# Patient Record
Sex: Female | Born: 1990 | Race: White | Hispanic: No | Marital: Single | Smoking: Current every day smoker
Health system: Southern US, Community
[De-identification: ages and names within clinical notes are randomized; demographics above are authoritative.]

## PROBLEM LIST (undated history)

## (undated) DIAGNOSIS — N760 Acute vaginitis: Secondary | ICD-10-CM

## (undated) DIAGNOSIS — N946 Dysmenorrhea, unspecified: Secondary | ICD-10-CM

## (undated) DIAGNOSIS — F17200 Nicotine dependence, unspecified, uncomplicated: Secondary | ICD-10-CM

## (undated) DIAGNOSIS — K921 Melena: Secondary | ICD-10-CM

## (undated) DIAGNOSIS — N898 Other specified noninflammatory disorders of vagina: Secondary | ICD-10-CM

## (undated) DIAGNOSIS — R109 Unspecified abdominal pain: Secondary | ICD-10-CM

## (undated) DIAGNOSIS — B9689 Other specified bacterial agents as the cause of diseases classified elsewhere: Secondary | ICD-10-CM

## (undated) DIAGNOSIS — R1084 Generalized abdominal pain: Secondary | ICD-10-CM

## (undated) DIAGNOSIS — K641 Second degree hemorrhoids: Secondary | ICD-10-CM

## (undated) DIAGNOSIS — L709 Acne, unspecified: Secondary | ICD-10-CM

## (undated) DIAGNOSIS — R102 Pelvic and perineal pain: Secondary | ICD-10-CM

---
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
## 2018-03-28 ENCOUNTER — Inpatient Hospital Stay
Admit: 2018-03-28 | Discharge: 2018-03-28 | Disposition: A | Discharge status: Home / Self Care | Payer: MEDICAID | Attending: Emergency Medicine

## 2018-03-28 DIAGNOSIS — F1110 Opioid abuse, uncomplicated: Secondary | ICD-10-CM

## 2018-03-28 LAB — CBC WITH AUTO DIFFERENTIAL
Basophils %: 0.6 % (ref 0–3)
Eosinophils %: 2.1 % (ref 0–5)
Hematocrit: 42 % (ref 37.0–50.0)
Hemoglobin: 13.4 gm/dl (ref 13.0–17.2)
Immature Granulocytes: 0.2 % (ref 0.0–3.0)
Lymphocytes %: 28.7 % (ref 28–48)
MCH: 28.2 pg (ref 25.4–34.6)
MCHC: 31.9 gm/dl (ref 30.0–36.0)
MCV: 88.2 fL (ref 80.0–98.0)
MPV: 9.9 fL (ref 6.0–10.0)
Monocytes %: 9 % (ref 1–13)
Neutrophils %: 59.4 % (ref 34–64)
Nucleated RBCs: 0 (ref 0–0)
Platelets: 235 10*3/uL (ref 140–450)
RBC: 4.76 M/uL (ref 3.60–5.20)
RDW-SD: 43.8 (ref 36.4–46.3)
WBC: 6.3 10*3/uL (ref 4.0–11.0)

## 2018-03-28 LAB — POC URINE MACROSCOPIC
Bilirubin, Urine: NEGATIVE
Bilirubin: NEGATIVE
Blood, Urine: NEGATIVE
Blood: NEGATIVE
Glucose, Ur: NEGATIVE mg/dl
Glucose: NEGATIVE mg/dl
Ketone: NEGATIVE mg/dl
Ketones, Urine: NEGATIVE mg/dl
Leukocyte Esterase, Urine: NEGATIVE
Leukocyte Esterase: NEGATIVE
Nitrite, Urine: NEGATIVE
Nitrites: NEGATIVE
Protein, UA: 30 mg/dl — AB
Protein: 30 mg/dl — AB
Specific Gravity, UA: 1.015 (ref 1.005–1.030)
Specific gravity: 1.015 (ref 1.005–1.030)
Urobilinogen, UA, POCT: 0.2 EU/dl (ref 0.0–1.0)
Urobilinogen: 0.2 EU/dl (ref 0.0–1.0)
pH (UA): 7 (ref 5–9)
pH, UA: 7 (ref 5–9)

## 2018-03-28 LAB — DRUG SCREEN, URINE
Amphetamine: NEGATIVE
Amphetamines: NEGATIVE
Barbiturates: NEGATIVE
Barbiturates: NEGATIVE
Benzodiazapines: POSITIVE — AB
Benzodiazepines: POSITIVE — AB
Cocaine: POSITIVE — AB
Cocaine: POSITIVE — AB
Marijuana: POSITIVE — AB
Marijuana: POSITIVE — AB
Methadone: NEGATIVE
Methadone: NEGATIVE
Opiates: POSITIVE — AB
Opiates: POSITIVE — AB
Phencyclidine: NEGATIVE
Phencyclidine: NEGATIVE

## 2018-03-28 LAB — COMPREHENSIVE METABOLIC PANEL
ALT: 12 U/L (ref 12–78)
AST: 23 U/L (ref 15–37)
Albumin: 3.8 gm/dl (ref 3.4–5.0)
Alkaline Phosphatase: 80 U/L (ref 45–117)
Anion Gap: 6 mmol/L (ref 5–15)
BUN: 14 mg/dl (ref 7–25)
CO2: 33 mEq/L — ABNORMAL HIGH (ref 21–32)
Calcium: 9.2 mg/dl (ref 8.5–10.1)
Chloride: 103 mEq/L (ref 98–107)
Creatinine: 0.9 mg/dl (ref 0.6–1.3)
EGFR IF NonAfrican American: >60
GFR African American: >60
Glucose: 60 mg/dl — ABNORMAL LOW (ref 74–106)
Potassium: 3.9 mEq/L (ref 3.5–5.1)
Sodium: 142 mEq/L (ref 136–145)
Total Bilirubin: 0.3 mg/dl (ref 0.2–1.0)
Total Protein: 8 gm/dl (ref 6.4–8.2)

## 2018-03-28 LAB — ETHYL ALCOHOL
ALCOHOL(ETHYL),SERUM: <3 mg/dl (ref 0.0–9.0)
Ethyl Alcohol: <3 mg/dl (ref 0.0–9.0)

## 2018-03-28 LAB — METABOLIC PANEL, COMPREHENSIVE
ALT (SGPT): 12 U/L (ref 12–78)
AST (SGOT): 23 U/L (ref 15–37)
Albumin: 3.8 gm/dl (ref 3.4–5.0)
Alk. phosphatase: 80 U/L (ref 45–117)
Anion gap: 6 mmol/L (ref 5–15)
BUN: 14 mg/dl (ref 7–25)
Bilirubin, total: 0.3 mg/dl (ref 0.2–1.0)
CO2: 33 mEq/L — ABNORMAL HIGH (ref 21–32)
Calcium: 9.2 mg/dl (ref 8.5–10.1)
Chloride: 103 mEq/L (ref 98–107)
Creatinine: 0.9 mg/dl (ref 0.6–1.3)
GFR est AA: >60
GFR est non-AA: >60
Glucose: 60 mg/dl — ABNORMAL LOW (ref 74–106)
Potassium: 3.9 mEq/L (ref 3.5–5.1)
Protein, total: 8 gm/dl (ref 6.4–8.2)
Sodium: 142 mEq/L (ref 136–145)

## 2018-03-28 LAB — CBC WITH AUTOMATED DIFF
BASOPHILS: 0.6 % (ref 0–3)
EOSINOPHILS: 2.1 % (ref 0–5)
HCT: 42 % (ref 37.0–50.0)
HGB: 13.4 gm/dl (ref 13.0–17.2)
IMMATURE GRANULOCYTES: 0.2 % (ref 0.0–3.0)
LYMPHOCYTES: 28.7 % (ref 28–48)
MCH: 28.2 pg (ref 25.4–34.6)
MCHC: 31.9 gm/dl (ref 30.0–36.0)
MCV: 88.2 fL (ref 80.0–98.0)
MONOCYTES: 9 % (ref 1–13)
MPV: 9.9 fL (ref 6.0–10.0)
NEUTROPHILS: 59.4 % (ref 34–64)
NRBC: 0 (ref 0–0)
PLATELET: 235 10*3/uL (ref 140–450)
RBC: 4.76 M/uL (ref 3.60–5.20)
RDW-SD: 43.8 (ref 36.4–46.3)
WBC: 6.3 10*3/uL (ref 4.0–11.0)

## 2018-03-28 MED ORDER — NALOXONE 2 MG/ACTUATION NASAL SPRAY
2 mg/actuation | NASAL | 0 refills | Status: AC
Start: 2018-03-28 — End: ?

## 2018-03-28 MED ORDER — ONDANSETRON 4 MG TAB, RAPID DISSOLVE
4 mg | ORAL | Status: AC
Start: 2018-03-28 — End: 2018-03-28
  Administered 2018-03-28: 23:00:00 via ORAL

## 2018-03-28 MED ORDER — ONDANSETRON HCL 4 MG TAB
4 mg | ORAL_TABLET | Freq: Three times a day (TID) | ORAL | 0 refills | Status: AC | PRN
Start: 2018-03-28 — End: 2018-03-31

## 2018-03-28 MED ORDER — BUPRENORPHINE-NALOXONE 8 MG-2 MG SUBLINGUAL TAB
8-2 mg | Freq: Once | SUBLINGUAL | Status: AC
Start: 2018-03-28 — End: 2018-03-28
  Administered 2018-03-28: 21:00:00 via SUBLINGUAL

## 2018-03-28 MED ORDER — BUPRENORPHINE 4 MG-NALOXONE 1 MG SUBLINGUAL FILM
4-1 mg | ORAL_FILM | Freq: Every day | SUBLINGUAL | 0 refills | Status: AC
Start: 2018-03-28 — End: 2018-03-30

## 2018-03-28 MED ORDER — CLONIDINE 0.1 MG TAB
0.1 mg | ORAL | Status: AC
Start: 2018-03-28 — End: 2018-03-28
  Administered 2018-03-28: 23:00:00 via ORAL

## 2018-03-28 MED FILL — BUPRENORPHINE-NALOXONE 8 MG-2 MG SUBLINGUAL TAB: 8-2 mg | SUBLINGUAL | Qty: 1

## 2018-03-28 MED FILL — ONDANSETRON 4 MG TAB, RAPID DISSOLVE: 4 mg | ORAL | Qty: 1

## 2018-03-28 MED FILL — CLONIDINE 0.1 MG TAB: 0.1 mg | ORAL | Qty: 1

## 2018-03-28 NOTE — ED Notes (Signed)
7:00 PM  03/28/18     Discharge instructions given to patient (name) with verbalization of understanding. Patient accompanied by patient.  Patient discharged with the following prescriptions   Current Discharge Medication List      START taking these medications    Details   buprenorphine-naloxone (SUBOXONE) 4-1 mg film sublingual film 2 Film by SubLINGual route daily for 2 days. Max Daily Amount: 4 Film. First dose take 2 films (8mg) 24 hours after discharge from Emergency Room.  You may take 1 additional film every hour after first dose if withdrawal symptoms persist.  Maximum 4 films daily (16mg)  Indications: Opioid use  Qty: 8 Film, Refills: 0    Comments: May substitute tablets for film if less expensive.  Please call if 4 mg strength not available  Associated Diagnoses: Opiate abuse, continuous (HCC)      ondansetron hcl (ZOFRAN) 4 mg tablet Take 1 Tab by mouth every eight (8) hours as needed for Nausea for up to 3 days.  Qty: 15 Tab, Refills: 0      naloxone 2 mg/actuation spry Use 1 spray intranasally, then discard. Repeat with new spray every 2 min as needed for opioid overdose symptoms, alternating nostrils.  Indications: Decrease in Rate & Depth of Breathing due to Opioid Drug  Qty: 2 Units, Refills: 0          . Patient discharged to Home (destination).      Kenneth R Emilio, RN

## 2018-03-28 NOTE — ED Provider Notes (Signed)
[facility]  Emergency Department Treatment Report    Patient: Shannon Acevedo Age: 27 y.o. Sex: female    Date of Birth: [birth year] Admit Date: 03/28/2018 PCP: None   MRN: [id number]  CSN: [id number]  Attending: Tawanna Cooler, MD   Room: ER22/ER22 Time Dictated: 10:58 PM        Chief Complaint   Chief Complaint   Patient presents with   ??? Mental Health Problem     [facility] program       History of Present Illness     Patient with history of polysubstance abuse, including heroin, Xanax, cocaine presents for evaluation of withdrawal symptoms.  She is interested in detoxification from heroin.  She reports abdominal pain and diarrhea and nausea very typical for her normal withdrawal symptoms.  Last use was at 3 PM yesterday.  She denies any SI or HI, no other complaints at this time.    Review of Systems   Review of Systems   Constitutional: Negative for fever.   HENT: Negative for sore throat.    Eyes: Negative for double vision.   Respiratory: Negative for shortness of breath.    Cardiovascular: Negative for chest pain.   Gastrointestinal: Positive for diarrhea and nausea. Negative for abdominal pain.   Genitourinary: Negative for dysuria.   Musculoskeletal: Negative for back pain.   Skin: Negative for rash.   Neurological: Negative for headaches.   Psychiatric/Behavioral: Positive for substance abuse. Negative for suicidal ideas.       Past Medical/Surgical History     Past Medical History:   Diagnosis Date   ??? Abuse    ??? Anemia NEC    ??? Anxiety    ??? Arrhythmia    ??? Bipolar 1 disorder, depressed ([place])    ??? Depression    ??? Depression    ??? Trauma      Past Surgical History:   Procedure Laterality Date   ??? HX GYN         Social History     Social History     Socioeconomic History   ??? Marital status: SINGLE     Spouse name: Not on file   ??? Number of children: Not on file   ??? Years of education: Not on file   ??? Highest education level: Not on file   Tobacco Use    ??? Smoking status: Current Every Day Smoker     Packs/day: 1.00     Years: 2.00     Pack years: 2.00   ??? Smokeless tobacco: Never Used   Substance and Sexual Activity   ??? Alcohol use: No   ??? Drug use: Yes     Types: Heroin, Cocaine, Marijuana   ??? Sexual activity: Never       Family History     Family History   Problem Relation Age of Onset   ??? Asthma Mother    ??? Alcohol abuse Father    ??? Psychiatric Disorder Father    ??? Cancer Paternal Grandmother    ??? Heart Disease Paternal Grandfather        Current Medications     None       Allergies     Allergies   Allergen Reactions   ??? Penicillins Anaphylaxis       Physical Exam/ED Course / Medical Decision Making     ED Triage Vitals [03/28/18 1522]   Enc Vitals Group      BP 117/75  Pulse (Heart Rate) 96      Resp Rate 18      Temp 98.3 ??F (36.8 ??C)      Temp src       O2 Sat (%) 99 %      Weight 139 lb 15.9 oz      Height 5' 7"       Head Circumference       Peak Flow       Pain Score       Pain Loc       Pain Edu?       Excl. in [place]?          Physical exam:  General:  Well-developed, well-nourished, no apparent distress.    Head:  Normocephalic atraumatic.    Eyes:  Pupils midrange extraocular movements intact.  No pallor or conjunctival injection.    Nose:  No rhinorrhea, inspection grossly normal.    Ears:  Grossly normal to inspection, no discharge.    Mouth:  Mucous membranes moist, no appreciable intraoral lesion.    Neck/Back:  Trachea midline, no asymmetry.    Chest:  Grossly normal inspection, symmetric chest rise.    Pulmonary:  Clear to auscultation bilaterally no wheezes rhonchi or rales.    Cardiovascular:  S1-S2 no murmurs rubs or gallops.    Abdomen: Soft, nontender, nondistended no guarding rebound or peritoneal signs.    Extremities:  Grossly normal to inspection, peripheral pulses intact    Neurologic:  Alert and oriented no appreciable focal neurologic deficit    Skin:  Warm and dry  Psychiatric:  Grossly normal mood and affect   Nursing note reviewed, vital signs reviewed.    ED course:  Patient presents with mild opiate withdrawal symptoms, candidate for the [facility] program she is afebrile and not tachycardic saturation normal on room air we will rule out pregnancy and send basic labs, initiate Suboxone therapy in ED if appropriate     Please see nursing documentation for DSM 5 diagnostic sheet and COWS scale.    COWS 11, given suboxone here.    Labs unremarkable    Patient developed opiate withdrawal symptoms, was treated here, she was stable to follow up as an outpatient, given medications for opiate withdrawal    Patient's presentation, history, physical exam and laboratory evaluations were reviewed.  At this time patient was felt to be stable for outpatient management and follow with primary care/specialist.  Patient was instructed to return to the emergency department with any concerns.    Disposition:    Discharged home      Portions of this chart were created with Dragon medical speech to text program.   Unrecognized errors may be present.          Final Diagnosis       ICD-10-CM ICD-9-CM   1. Opiate abuse, continuous (HCC) F11.10 305.51         Diagnostic Studies   Lab:   Recent Results (from the past 12 hour(s))   CBC WITH AUTOMATED DIFF    Collection Time: 03/28/18  4:33 PM   Result Value Ref Range    WBC 6.3 4.0 - 11.0 1000/mm3    RBC 4.76 3.60 - 5.20 M/uL    HGB 13.4 13.0 - 17.2 gm/dl    HCT 42.0 37.0 - 50.0 %    MCV 88.2 80.0 - 98.0 fL    MCH 28.2 25.4 - 34.6 pg    MCHC 31.9 30.0 - 36.0 gm/dl  PLATELET 235 140 - 450 1000/mm3    MPV 9.9 6.0 - 10.0 fL    RDW-SD 43.8 36.4 - 46.3      NRBC 0 0 - 0      IMMATURE GRANULOCYTES 0.2 0.0 - 3.0 %    NEUTROPHILS 59.4 34 - 64 %    LYMPHOCYTES 28.7 28 - 48 %    MONOCYTES 9.0 1 - 13 %    EOSINOPHILS 2.1 0 - 5 %    BASOPHILS 0.6 0 - 3 %   METABOLIC PANEL, COMPREHENSIVE    Collection Time: 03/28/18  4:33 PM   Result Value Ref Range    Sodium 142 136 - 145 mEq/L     Potassium 3.9 3.5 - 5.1 mEq/L    Chloride 103 98 - 107 mEq/L    CO2 33 (H) 21 - 32 mEq/L    Glucose 60 (L) 74 - 106 mg/dl    BUN 14 7 - 25 mg/dl    Creatinine 0.9 0.6 - 1.3 mg/dl    GFR est AA >60.0      GFR est non-AA >60      Calcium 9.2 8.5 - 10.1 mg/dl    AST (SGOT) 23 15 - 37 U/L    ALT (SGPT) 12 12 - 78 U/L    Alk. phosphatase 80 45 - 117 U/L    Bilirubin, total 0.3 0.2 - 1.0 mg/dl    Protein, total 8.0 6.4 - 8.2 gm/dl    Albumin 3.8 3.4 - 5.0 gm/dl    Anion gap 6 5 - 15 mmol/L   ETHYL ALCOHOL    Collection Time: 03/28/18  4:33 PM   Result Value Ref Range    ALCOHOL(ETHYL),SERUM <3.0 0.0 - 9.0 mg/dl   HCG URINE, QL    Collection Time: 03/28/18  5:08 PM   Result Value Ref Range    HCG urine, QL NEGATIVE NEGATIVE     DRUG SCREEN, URINE    Collection Time: 03/28/18  5:12 PM   Result Value Ref Range    Amphetamine NEGATIVE NEGATIVE      Barbiturates NEGATIVE NEGATIVE      Benzodiazepines POSITIVE (A) NEGATIVE      Cocaine POS (A) NEGATIVE      Marijuana POSITIVE (A) NEGATIVE      Methadone NEGATIVE NEGATIVE      Opiates POSITIVE (A) NEGATIVE      Phencyclidine NEGATIVE NEGATIVE     POC URINE MACROSCOPIC    Collection Time: 03/28/18  5:12 PM   Result Value Ref Range    Glucose Negative NEGATIVE,Negative mg/dl    Bilirubin Negative NEGATIVE,Negative      Ketone Negative NEGATIVE,Negative mg/dl    Specific gravity 1.015 1.005 - 1.030      Blood Negative NEGATIVE,Negative      pH (UA) 7.0 5 - 9      Protein 30 (A) NEGATIVE,Negative mg/dl    Urobilinogen 0.2 0.0 - 1.0 EU/dl    Nitrites Negative NEGATIVE,Negative      Leukocyte Esterase Negative NEGATIVE,Negative      Color Yellow      Appearance Clear         Imaging:    No results found.               Medications   buprenorphine-naloxone (SUBOXONE) 8-18m SL tablet (1 Tab SubLINGual Given 03/28/18 1726)   ondansetron (ZOFRAN ODT) tablet 4 mg (4 mg Oral Given 03/28/18 1844)   cloNIDine HCl (CATAPRES) tablet 0.1  mg (0.1 mg Oral Given 03/28/18 1854)           Results of lab/radiology tests were discussed with the patient. . All questions were answered and concerns addressed.        My signature above authenticates this document and my orders, the final    diagnosis (es), discharge prescription (s), and instructions in the Epic record.  If you have any questions please contact [phone].

## 2018-03-28 NOTE — ED Notes (Signed)
Pt provided with [facility] program packet to begin reviewing.

## 2018-03-28 NOTE — ED Triage Notes (Signed)
Pt here for withdrawal symptoms from heroin, pt is sweating, vomiting diarrhea,  Last use yesterday 1500.    Interested in the [facility] program.

## 2018-03-28 NOTE — Progress Notes (Signed)
Behavioral Health:  This writer consulted by Dr. Sharkey on Pt, a 27 y.o. Caucasian American female in bed #22, with a history depression, who presented to [facility] with c/o symptoms of withdrawal.  Pt denies suicidal/ homicidal ideations and psychosis. This writer provided psychoeducation on the [facility]OU[facility] program. Pt expressed a desire to voluntary participate in the [facility] Program.  This writer provided Pt with support to complete ROI for the [facility]. Plan:  Pt will voluntarily participate in the [facility] Program via [facility].  ROI was obtained, faxed to [facility] and  given to registration to scan into chart.

## 2018-03-28 NOTE — ED Notes (Signed)
Pt provided with [facility] program packet to begin reviewing.

## 2018-03-28 NOTE — ED Notes (Signed)
Pt here for withdrawal symptoms from heroin, pt is sweating, vomiting diarrhea,  Last use yesterday 1500.    [facility]ed in the [facility].

## 2018-03-28 NOTE — Progress Notes (Signed)
Behavioral Health:  This Clinical research associatewriter consulted by Dr. Sherian MaroonSharkey on Pt, a 27 y.o. Caucasian American female in bed #22, with a history depression, who presented to [facility] with c/o symptoms of withdrawal.  Pt denies suicidal/ homicidal ideations and psychosis. This Clinical research associatewriter provided psychoeducation on the [facility]OU[facility] program. Pt expressed a desire to voluntary participate in the [facility] Program.  This Clinical research associatewriter provided Pt with support to complete ROI for the [facility]. Plan:  Pt will voluntarily participate in the [facility] Program via [facility].  ROI was obtained, faxed to [facility] and  given to registration to scan into chart.

## 2018-03-28 NOTE — ED Notes (Signed)
7:00 PM  03/28/18     Discharge instructions given to patient (name) with verbalization of understanding. Patient accompanied by patient.  Patient discharged with the following prescriptions   Current Discharge Medication List      START taking these medications    Details   buprenorphine-naloxone (SUBOXONE) 4-1 mg film sublingual film 2 Film by SubLINGual route daily for 2 days. Max Daily Amount: 4 Film. First dose take 2 films (8mg ) 24 hours after discharge from Emergency Room.  You may take 1 additional film every hour after first dose if withdrawal symptoms persist.  Maximum 4 films daily (16mg )  Indications: Opioid use  Qty: 8 Film, Refills: 0    Comments: May substitute tablets for film if less expensive.  Please call if 4 mg strength not available  Associated Diagnoses: Opiate abuse, continuous (HCC)      ondansetron hcl (ZOFRAN) 4 mg tablet Take 1 Tab by mouth every eight (8) hours as needed for Nausea for up to 3 days.  Qty: 15 Tab, Refills: 0      naloxone 2 mg/actuation spry Use 1 spray intranasally, then discard. Repeat with new spray every 2 min as needed for opioid overdose symptoms, alternating nostrils.  Indications: Decrease in Rate & Depth of Breathing due to Opioid Drug  Qty: 2 Units, Refills: 0          . Patient discharged to Home (destination).      Dewitt HoesKenneth R Emilio, RN

## 2018-03-28 NOTE — ED Provider Notes (Signed)
[place]  Emergency Department Treatment Report    Patient: Shannon Acevedo Age: 27 y.o. Sex: female    Date of Birth: [birth year] Admit Date: 03/28/2018 PCP: None   MRN: [id number]  CSN: [id number]  Attending: Tawanna Cooler, MD   Room: ER22/ER22 Time Dictated: 10:58 PM        Chief Complaint   Chief Complaint   Patient presents with   ??? Mental Health Problem     [facility] program       History of Present Illness     Patient with history of polysubstance abuse, including heroin, Xanax, cocaine presents for evaluation of withdrawal symptoms.  She is interested in detoxification from heroin.  She reports abdominal pain and diarrhea and nausea very typical for her normal withdrawal symptoms.  Last use was at 3 PM yesterday.  She denies any SI or HI, no other complaints at this time.    Review of Systems   Review of Systems   Constitutional: Negative for fever.   HENT: Negative for sore throat.    Eyes: Negative for double vision.   Respiratory: Negative for shortness of breath.    Cardiovascular: Negative for chest pain.   Gastrointestinal: Positive for diarrhea and nausea. Negative for abdominal pain.   Genitourinary: Negative for dysuria.   Musculoskeletal: Negative for back pain.   Skin: Negative for rash.   Neurological: Negative for headaches.   Psychiatric/Behavioral: Positive for substance abuse. Negative for suicidal ideas.       Past Medical/Surgical History     Past Medical History:   Diagnosis Date   ??? Abuse    ??? Anemia NEC    ??? Anxiety    ??? Arrhythmia    ??? Bipolar 1 disorder, depressed ([facility])    ??? Depression    ??? Depression    ??? Trauma      Past Surgical History:   Procedure Laterality Date   ??? HX GYN         Social History     Social History     Socioeconomic History   ??? Marital status: SINGLE     Spouse name: Not on file   ??? Number of children: Not on file   ??? Years of education: Not on file   ??? Highest education level: Not on file   Tobacco Use   ??? Smoking status: Current Every Day Smoker      Packs/day: 1.00     Years: 2.00     Pack years: 2.00   ??? Smokeless tobacco: Never Used   Substance and Sexual Activity   ??? Alcohol use: No   ??? Drug use: Yes     Types: Heroin, Cocaine, Marijuana   ??? Sexual activity: Never       Family History     Family History   Problem Relation Age of Onset   ??? Asthma Mother    ??? Alcohol abuse Father    ??? Psychiatric Disorder Father    ??? Cancer Paternal Grandmother    ??? Heart Disease Paternal Grandfather        Current Medications     None       Allergies     Allergies   Allergen Reactions   ??? Penicillins Anaphylaxis       Physical Exam/ED Course / Medical Decision Making     ED Triage Vitals [03/28/18 1522]   Enc Vitals Group      BP 117/75  Pulse (Heart Rate) 96      Resp Rate 18      Temp 98.3 ??F (36.8 ??C)      Temp src       O2 Sat (%) 99 %      Weight 139 lb 15.9 oz      Height '5\' 7"'       Head Circumference       Peak Flow       Pain Score       Pain Loc       Pain Edu?       Excl. in [place]?          Physical exam:  General:  Well-developed, well-nourished, no apparent distress.    Head:  Normocephalic atraumatic.    Eyes:  Pupils midrange extraocular movements intact.  No pallor or conjunctival injection.    Nose:  No rhinorrhea, inspection grossly normal.    Ears:  Grossly normal to inspection, no discharge.    Mouth:  Mucous membranes moist, no appreciable intraoral lesion.    Neck/Back:  Trachea midline, no asymmetry.    Chest:  Grossly normal inspection, symmetric chest rise.    Pulmonary:  Clear to auscultation bilaterally no wheezes rhonchi or rales.    Cardiovascular:  S1-S2 no murmurs rubs or gallops.    Abdomen: Soft, nontender, nondistended no guarding rebound or peritoneal signs.    Extremities:  Grossly normal to inspection, peripheral pulses intact    Neurologic:  Alert and oriented no appreciable focal neurologic deficit    Skin:  Warm and dry  Psychiatric:  Grossly normal mood and affect  Nursing note reviewed, vital signs reviewed.    ED course:  Patient  presents with mild opiate withdrawal symptoms, candidate for the [facility] program she is afebrile and not tachycardic saturation normal on room air we will rule out pregnancy and send basic labs, initiate Suboxone therapy in ED if appropriate     Please see nursing documentation for DSM 5 diagnostic sheet and COWS scale.    COWS 11, given suboxone here.    Labs unremarkable    Patient developed opiate withdrawal symptoms, was treated here, she was stable to follow up as an outpatient, given medications for opiate withdrawal    Patient's presentation, history, physical exam and laboratory evaluations were reviewed.  At this time patient was felt to be stable for outpatient management and follow with primary care/specialist.  Patient was instructed to return to the emergency department with any concerns.    Disposition:    Discharged home      Portions of this chart were created with Dragon medical speech to text program.   Unrecognized errors may be present.          Final Diagnosis       ICD-10-CM ICD-9-CM   1. Opiate abuse, continuous (HCC) F11.10 305.51         Diagnostic Studies   Lab:   Recent Results (from the past 12 hour(s))   CBC WITH AUTOMATED DIFF    Collection Time: 03/28/18  4:33 PM   Result Value Ref Range    WBC 6.3 4.0 - 11.0 1000/mm3    RBC 4.76 3.60 - 5.20 M/uL    HGB 13.4 13.0 - 17.2 gm/dl    HCT 42.0 37.0 - 50.0 %    MCV 88.2 80.0 - 98.0 fL    MCH 28.2 25.4 - 34.6 pg    MCHC 31.9 30.0 - 36.0 gm/dl  PLATELET 235 140 - 450 1000/mm3    MPV 9.9 6.0 - 10.0 fL    RDW-SD 43.8 36.4 - 46.3      NRBC 0 0 - 0      IMMATURE GRANULOCYTES 0.2 0.0 - 3.0 %    NEUTROPHILS 59.4 34 - 64 %    LYMPHOCYTES 28.7 28 - 48 %    MONOCYTES 9.0 1 - 13 %    EOSINOPHILS 2.1 0 - 5 %    BASOPHILS 0.6 0 - 3 %   METABOLIC PANEL, COMPREHENSIVE    Collection Time: 03/28/18  4:33 PM   Result Value Ref Range    Sodium 142 136 - 145 mEq/L    Potassium 3.9 3.5 - 5.1 mEq/L    Chloride 103 98 - 107 mEq/L    CO2 33 (H) 21 - 32 mEq/L     Glucose 60 (L) 74 - 106 mg/dl    BUN 14 7 - 25 mg/dl    Creatinine 0.9 0.6 - 1.3 mg/dl    GFR est AA >60.0      GFR est non-AA >60      Calcium 9.2 8.5 - 10.1 mg/dl    AST (SGOT) 23 15 - 37 U/L    ALT (SGPT) 12 12 - 78 U/L    Alk. phosphatase 80 45 - 117 U/L    Bilirubin, total 0.3 0.2 - 1.0 mg/dl    Protein, total 8.0 6.4 - 8.2 gm/dl    Albumin 3.8 3.4 - 5.0 gm/dl    Anion gap 6 5 - 15 mmol/L   ETHYL ALCOHOL    Collection Time: 03/28/18  4:33 PM   Result Value Ref Range    ALCOHOL(ETHYL),SERUM <3.0 0.0 - 9.0 mg/dl   HCG URINE, QL    Collection Time: 03/28/18  5:08 PM   Result Value Ref Range    HCG urine, QL NEGATIVE NEGATIVE     DRUG SCREEN, URINE    Collection Time: 03/28/18  5:12 PM   Result Value Ref Range    Amphetamine NEGATIVE NEGATIVE      Barbiturates NEGATIVE NEGATIVE      Benzodiazepines POSITIVE (A) NEGATIVE      Cocaine POS (A) NEGATIVE      Marijuana POSITIVE (A) NEGATIVE      Methadone NEGATIVE NEGATIVE      Opiates POSITIVE (A) NEGATIVE      Phencyclidine NEGATIVE NEGATIVE     POC URINE MACROSCOPIC    Collection Time: 03/28/18  5:12 PM   Result Value Ref Range    Glucose Negative NEGATIVE,Negative mg/dl    Bilirubin Negative NEGATIVE,Negative      Ketone Negative NEGATIVE,Negative mg/dl    Specific gravity 1.015 1.005 - 1.030      Blood Negative NEGATIVE,Negative      pH (UA) 7.0 5 - 9      Protein 30 (A) NEGATIVE,Negative mg/dl    Urobilinogen 0.2 0.0 - 1.0 EU/dl    Nitrites Negative NEGATIVE,Negative      Leukocyte Esterase Negative NEGATIVE,Negative      Color Yellow      Appearance Clear         Imaging:    No results found.               Medications   buprenorphine-naloxone (SUBOXONE) 8-21m SL tablet (1 Tab SubLINGual Given 03/28/18 1726)   ondansetron (ZOFRAN ODT) tablet 4 mg (4 mg Oral Given 03/28/18 1844)   cloNIDine HCl (CATAPRES) tablet 0.1  mg (0.1 mg Oral Given 03/28/18 1854)          Results of lab/radiology tests were discussed with the patient. . All questions were answered and concerns  addressed.        My signature above authenticates this document and my orders, the final    diagnosis (es), discharge prescription (s), and instructions in the Epic record.  If you have any questions please contact [phone].

## 2018-03-29 LAB — HCG URINE, QL
HCG urine, QL: NEGATIVE
Pregnancy Test(Urn): NEGATIVE

## 2018-04-04 ENCOUNTER — Inpatient Hospital Stay
Admit: 2018-04-04 | Discharge: 2018-04-05 | Disposition: A | Discharge status: Home / Self Care | Payer: MEDICAID | Attending: Emergency Medicine

## 2018-04-04 DIAGNOSIS — F1123 Opioid dependence with withdrawal: Secondary | ICD-10-CM

## 2018-04-04 MED ORDER — BUPRENORPHINE 4 MG-NALOXONE 1 MG SUBLINGUAL FILM
4-1 mg | ORAL_FILM | Freq: Every day | SUBLINGUAL | 0 refills | Status: AC
Start: 2018-04-04 — End: 2018-04-07

## 2018-04-04 MED ORDER — BUPRENORPHINE-NALOXONE 8 MG-2 MG SUBLINGUAL TAB
8-2 mg | Freq: Once | SUBLINGUAL | Status: AC
Start: 2018-04-04 — End: 2018-04-04
  Administered 2018-04-04: via SUBLINGUAL

## 2018-04-04 MED FILL — BUPRENORPHINE-NALOXONE 8 MG-2 MG SUBLINGUAL TAB: 8-2 mg | SUBLINGUAL | Qty: 1

## 2018-04-04 NOTE — ED Provider Notes (Signed)
[facility]  Emergency Department Treatment Report        Patient: Shannon Acevedo Age: 27 y.o. Sex: female    Date of Birth: July 16, 1991 Admit Date: 04/04/2018 PCP: None   MRN: [id number]  CSN: [id number]  Attending: Jannetta Quint, DO   Room: H06/H06 Time Dictated: 7:31 PM [id number]       Chief Complaint   Withdrawing from heroin    History of Present Illness   27 y.o. female states that she was seen in the emergency department a week ago because of heroin withdrawal.  She was discharged with prescription for Suboxone and states that she has been taking half of a strip because when she went to [facility]up with [facility] was told that they did not take her insurance.  She has a follow-up appointment with another facility on September 10 but her last dose of Suboxone was yesterday and she is feeling sweaty today and some associated abdominal cramping and diarrhea x2.  She is not vomiting.  She is taking Zofran for the nausea and that is been helping.  She states she last used heroin a week ago.    Review of Systems   Review of Systems   Constitutional: Negative for fever.        Positive sweats   Eyes: Negative for discharge and redness.   Respiratory: Negative for shortness of breath and stridor.    Cardiovascular: Negative for leg swelling.   Gastrointestinal: Positive for diarrhea and nausea.        Abdominal cramping   Musculoskeletal: Negative for falls.   Skin: Negative for rash.   Neurological: Negative for seizures.   Psychiatric/Behavioral: Positive for substance abuse.       Past Medical/Surgical History     Past Medical History:   Diagnosis Date   ??? Abuse    ??? Anemia NEC    ??? Anxiety    ??? Arrhythmia    ??? Bipolar 1 disorder, depressed (HCC)    ??? Depression    ??? Depression    ??? Trauma      Past Surgical History:   Procedure Laterality Date   ??? HX GYN         Social History     Social History     Socioeconomic History   ??? Marital status: SINGLE     Spouse name: Not on file    ??? Number of children: Not on file   ??? Years of education: Not on file   ??? Highest education level: Not on file   Occupational History   ??? Not on file   Social Needs   ??? Financial resource strain: Not on file   ??? Food insecurity:     Worry: Not on file     Inability: Not on file   ??? Transportation needs:     Medical: Not on file     Non-medical: Not on file   Tobacco Use   ??? Smoking status: Current Every Day Smoker     Packs/day: 1.00     Years: 2.00     Pack years: 2.00   ??? Smokeless tobacco: Never Used   Substance and Sexual Activity   ??? Alcohol use: No   ??? Drug use: Yes     Types: Heroin, Cocaine, Marijuana   ??? Sexual activity: Never   Lifestyle   ??? Physical activity:     Days per week: Not on file     Minutes per  session: Not on file   ??? Stress: Not on file   Relationships   ??? Social connections:     Talks on phone: Not on file     Gets together: Not on file     Attends religious service: Not on file     Active member of club or organization: Not on file     Attends meetings of clubs or organizations: Not on file     Relationship status: Not on file   ??? Intimate partner violence:     Fear of current or ex partner: Not on file     Emotionally abused: Not on file     Physically abused: Not on file     Forced sexual activity: Not on file   Other Topics Concern   ??? Not on file   Social History Narrative   ??? Not on file       Family History     Family History   Problem Relation Age of Onset   ??? Asthma Mother    ??? Alcohol abuse Father    ??? Psychiatric Disorder Father    ??? Cancer Paternal Grandmother    ??? Heart Disease Paternal Grandfather        Current Medications     Prior to Admission Medications   Prescriptions Last Dose Informant Patient Reported? Taking?   naloxone 2 mg/actuation spry   No No   Sig: Use 1 spray intranasally, then discard. Repeat with new spray every 2 min as needed for opioid overdose symptoms, alternating nostrils.  Indications: Decrease in Rate & Depth of Breathing due to Opioid Drug       Facility-Administered Medications: None       Allergies     Allergies   Allergen Reactions   ??? Penicillins Anaphylaxis       Physical Exam     ED Triage Vitals   ED Encounter Vitals Group      BP 04/04/18 1841 117/77      Pulse (Heart Rate) 04/04/18 1841 83      Resp Rate 04/04/18 1841 14      Temp 04/04/18 1841 98.5 ??F (36.9 ??C)      Temp src --       O2 Sat (%) 04/04/18 1841 100 %      Weight 04/04/18 1826 130 lb      Height 04/04/18 1826 5\' 7"      Physical Exam   Constitutional: She is oriented to person, place, and time.   General appearance: Patient appears well-developed and well-nourished.   HENT:   Head: Normocephalic and atraumatic.   Mouth/Throat: Oropharynx is clear and moist.   Eyes: Pupils are equal, round, and reactive to light. Conjunctivae are normal.   Neck: Neck supple. No thyromegaly present.   Cardiovascular: Normal rate, regular rhythm and normal heart sounds.   No murmur heard.  Pulmonary/Chest: Effort normal. No respiratory distress. She has no wheezes.   Abdominal: Soft. There is no tenderness.   Musculoskeletal: She exhibits no edema.   Lymphadenopathy:     She has no cervical adenopathy.   Neurological: She is alert and oriented to person, place, and time.   Skin: Skin is warm and dry. No rash noted.   Vitals reviewed.       Impression and Management Plan   Patient who presents with concerns that she has finished the initial prescription of Suboxone she was given and she does not have an appointment until September 10.  I will consult crisis clinician.      ED Course   Spoke to crisis clinician and she was not able to reach [facility] at [facility] as they were closed for the long weekend did leave a message.  Patient states she has an appointment September 10.  [facility]ries [facility] try [facility] in hopes of being seen sooner.  Medications   buprenorphine-naloxone (SUBOXONE) 8-2mg  SL tablet (1 Tab SubLINGual Given 04/04/18 1950)        Medical Decision Making    Patient not tremulous and not actively vomiting but states his remained off the heroin and continues to want to stay off the heroin    Final Diagnosis       ICD-10-CM ICD-9-CM   1. Opioid abuse (HCC) F11.10 305.50       Disposition     Dicharge home   Current Discharge Medication List      START taking these medications    Details   buprenorphine-naloxone (SUBOXONE) 4-1 mg film sublingual film 1 Film by SubLINGual route daily for 3 days. Max Daily Amount: 4 Film. First dose take 2 films (8mg ) 24 hours after discharge from Emergency Room.  You may take 1 additional film every hour after first dose if withdrawal symptoms persist.  Maximum 4 films daily (16mg )  Indications: Opioid use  Qty: 12 Film, Refills: 0    Comments: May substitute tablets for film if less expensive.  Please call if 4 mg strength not available  Associated Diagnoses: Opioid abuse (HCC)             The patient was personally evaluated by myself and Dr. Wilma FlavinBLOOM, ADAM S, DO who agrees with the above assessment and plan.    Farrel Demarkiana Torion Hulgan, PA-C  April 04, 2018    My signature above authenticates this document and my orders, the final ??  diagnosis (es), discharge prescription (s), and instructions in the Epic ??  record.  If you have any questions please contact [phone].  ??  Nursing notes have been reviewed by the physician/ advanced practice ??  Clinician.    Dragon medical dictation software was used for portions of this report. Unintended voice recognition errors may occur.

## 2018-04-04 NOTE — Progress Notes (Signed)
Behavioral Health:  This writer consulted by PA D. Houle on Pt, a 27 y.o. Caucasian American female in bed H#6, with a history of heroin use.  Pt presented to [facility] on last Friday 03/28/18 with c/o symptoms of withdrawal.  This writer provided psychoeducation on the [facility] program to which Pt expressed a desire to voluntary participate in the [facility] Program.  However, Pt is presenting today stating that [facility] (a [facility] Provider) would not take her insurance, so she is deciding to change insurance (which will take 90 days), in order to participate in program.  Pt denies suicidal/ homicidal ideations and psychosis.  This writer provided psychoeducation on [facility]'s policy as it related to prescribing controlled substance and informed Pt that a voluntary detox program is going to yield the best results for recovery.  Plan:  This writer consulted with PA. Houle on disposition.  PA Houle decided to give Pt perscription for 3 days, pending follow up with provider for long term care.

## 2018-04-04 NOTE — ED Notes (Signed)
Pt d/c by provider

## 2018-04-04 NOTE — Progress Notes (Signed)
Behavioral Health:  This Clinical research associatewriter consulted by PA D. Houle on Pt, a 27 y.o. Caucasian American female in bed H#6, with a history of heroin use.  Pt presented to [facility] on last Friday 03/28/18 with c/o symptoms of withdrawal.  This Clinical research associatewriter provided psychoeducation on the [facility] program to which Pt [facility]  However, Pt is presenting today stating that [facility] (a [facility] Provider) would not take her insurance, so she is deciding to change insurance (which will take 90 days), in order to participate in program.  Pt denies suicidal/ homicidal ideations and psychosis.  This Clinical research associatewriter provided psychoeducation on [facility]'s policy as it related to prescribing controlled substance and informed Pt that a voluntary detox program is going to yield the best results for recovery.  Plan:  This Clinical research associatewriter consulted with PA. Houle on disposition.  PA Houle decided to give Pt perscription for 3 days, pending follow up with provider for long term care.

## 2018-04-04 NOTE — ED Provider Notes (Signed)
ED Provider Notes by Dorise Hiss, PA at 04/04/18 1931                Author: Dorise Hiss, PA  Service: Emergency Medicine  Author Type: Physician Assistant       Filed: 04/04/18 2010  Date of Service: 04/04/18 1931  Status: Attested           Editor: Dorise Hiss, PA (Physician Assistant)  Cosigner: Jannetta Quint, DO at 04/06/18 2336          Attestation signed by Jannetta Quint, DO at 04/06/18 2336          I, Corinna Gab, D.O., interviewed and examined the patient. I discussed with the mid-level provider and agree with the evaluation and plans as documented here.      I personally saw the patinet, I refilled her suboxe to last through the holiday weekend. I think her risk of harm from the suboxone or diversion to someone else is far less than her risk of death from heroin or other opioids.                                  [facility] Care   Emergency Department Treatment Report                Patient: Shannon Acevedo  Age: 27 y.o.  Sex: female          Date of Birth: 03-05-1991  Admit Date: 04/04/2018  PCP: None     MRN: [id number]   CSN: [id number]   Attending: Jannetta Quint, DO         Room: H06/H06  Time Dictated: 7:31 PM  [id number]           Chief Complaint    Withdrawing from heroin        History of Present Illness     27 y.o. female  states that she was seen in the emergency department a week ago because of heroin withdrawal.  She was discharged with prescription for Suboxone and states that she has been taking half of a strip because when she went to [facility]  was told that they did not take her insurance.  She has a follow-up appointment with another facility on September 10 but her last dose of Suboxone was yesterday and she is feeling sweaty today and some associated abdominal cramping and diarrhea x2.   She is not vomiting.  She is taking Zofran for the nausea and that is been helping.  She states she last used heroin a week ago.        Review of Systems      Review of Systems    Constitutional: Negative for fever.         Positive sweats    Eyes: Negative for discharge and redness.    Respiratory: Negative for shortness of breath and stridor.     Cardiovascular: Negative for leg swelling.    Gastrointestinal: Positive for diarrhea and nausea .         Abdominal cramping    Musculoskeletal: Negative for falls.    Skin: Negative for rash.    Neurological: Negative for seizures.    Psychiatric/Behavioral: Positive for substance abuse.            Past Medical/Surgical History          Past Medical History:  Diagnosis  Date         ?  Abuse       ?  Anemia NEC       ?  Anxiety       ?  Arrhythmia       ?  Bipolar 1 disorder, depressed (HCC)       ?  Depression       ?  Depression           ?  Trauma            Past Surgical History:         Procedure  Laterality  Date          ?  HX GYN                 Social History          Social History          Socioeconomic History         ?  Marital status:  SINGLE              Spouse name:  Not on file         ?  Number of children:  Not on file     ?  Years of education:  Not on file     ?  Highest education level:  Not on file       Occupational History        ?  Not on file       Social Needs         ?  Financial resource strain:  Not on file        ?  Food insecurity:              Worry:  Not on file         Inability:  Not on file        ?  Transportation needs:              Medical:  Not on file         Non-medical:  Not on file       Tobacco Use         ?  Smoking status:  Current Every Day Smoker              Packs/day:  1.00         Years:  2.00         Pack years:  2.00         ?  Smokeless tobacco:  Never Used       Substance and Sexual Activity         ?  Alcohol use:  No     ?  Drug use:  Yes              Types:  Heroin, Cocaine, Marijuana         ?  Sexual activity:  Never       Lifestyle        ?  Physical activity:              Days per week:  Not on file         Minutes per session:  Not on file         ?   Stress:  Not on file       Relationships        ?  Social connections:              Talks on phone:  Not on file         Gets together:  Not on file         Attends religious service:  Not on file         Active member of club or organization:  Not on file         Attends meetings of clubs or organizations:  Not on file         Relationship status:  Not on file        ?  Intimate partner violence:              Fear of current or ex partner:  Not on file         Emotionally abused:  Not on file         Physically abused:  Not on file         Forced sexual activity:  Not on file        Other Topics  Concern        ?  Not on file       Social History Narrative        ?  Not on file             Family History          Family History         Problem  Relation  Age of Onset          ?  Asthma  Mother       ?  Alcohol abuse  Father       ?  Psychiatric Disorder  Father       ?  Cancer  Paternal Grandmother            ?  Heart Disease  Paternal Grandfather               Current Medications          Prior to Admission Medications     Prescriptions  Last Dose  Informant  Patient Reported?  Taking?      naloxone 2 mg/actuation spry      No  No      Sig: Use 1 spray intranasally, then discard. Repeat with new spray every 2 min as needed for opioid overdose symptoms, alternating nostrils.  Indications:  Decrease in Rate & Depth of Breathing due to Opioid Drug               Facility-Administered Medications: None             Allergies          Allergies        Allergen  Reactions         ?  Penicillins  Anaphylaxis             Physical Exam          ED Triage Vitals     ED Encounter Vitals Group            BP  04/04/18 1841  117/77        Pulse (Heart Rate)  04/04/18 1841  83        Resp Rate  04/04/18 1841  14        Temp  04/04/18 1841  98.5 ??F (36.9 ??C)        Temp  src  --          O2 Sat (%)  04/04/18 1841  100 %        Weight  04/04/18 1826  130 lb            Height  04/04/18 1826  5\' 7"         Physical Exam     Constitutional: She is oriented to person, place, and time.   General appearance: Patient appears well-developed and well-nourished.     HENT:    Head: Normocephalic and atraumatic.    Mouth/Throat: Oropharynx is clear and moist.    Eyes: Pupils are equal, round, and reactive to light. Conjunctivae are normal.    Neck: Neck supple. No thyromegaly present.    Cardiovascular: Normal rate, regular rhythm and normal heart sounds.    No murmur heard.   Pulmonary/Chest: Effort normal. No respiratory distress. She has no wheezes.    Abdominal: Soft. There is no tenderness.   Musculoskeletal: She exhibits no edema.   Lymphadenopathy:     She has no cervical adenopathy.   Neurological:  She is alert and oriented to person, place, and time.    Skin: Skin is warm and dry. No rash noted.    Vitals reviewed.            Impression and Management Plan     Patient who presents with concerns that she has finished the initial prescription of Suboxone she was given and she does not have an appointment until September 10.   I will consult crisis clinician.           ED Course     Spoke to crisis clinician and she was not able to [facility] as they were closed for the long weekend did leave a message.  Patient states she has  an appointment September 10.  [facility] in hopes of being seen sooner.     Medications       buprenorphine-naloxone (SUBOXONE) 8-2mg  SL tablet (1 Tab SubLINGual Given 04/04/18 1950)              Medical Decision Making     Patient not tremulous and not actively vomiting but states his remained off the heroin and continues to want to stay off the heroin        Final Diagnosis                 ICD-10-CM  ICD-9-CM          1.  Opioid abuse (HCC)  F11.10  305.50             Disposition        Dicharge home      Current Discharge Medication List              START taking these medications          Details        buprenorphine-naloxone (SUBOXONE) 4-1 mg film sublingual film  1 Film by  SubLINGual route daily for 3 days. Max Daily Amount: 4 Film. First dose take 2 films (8mg ) 24 hours after discharge from Emergency Room.  You  may take 1 additional film every hour after first dose if withdrawal symptoms persist.  Maximum 4 films daily (16mg )  Indications: Opioid use   Qty: 12 Film, Refills:  0          Comments: May substitute tablets for film if  less expensive.  Please call if 4 mg strength not  available   Associated Diagnoses: Opioid abuse (HCC)                         The patient was personally evaluated by myself and Dr. Wilma Flavin, ADAM S, DO who agrees with the above assessment and plan.      Farrel Demark, PA-C   April 04, 2018      My signature above authenticates this document and my orders, the final ??   diagnosis (es), discharge prescription (s), and instructions in the Epic ??   record.   If you have any questions please contact [phone].   ??   Nursing notes have been reviewed by the physician/ advanced practice ??   Clinician.      Dragon medical dictation software was used for portions of this report. Unintended voice recognition errors may occur.

## 2018-04-04 NOTE — ED Notes (Signed)
Pt d/c by provider.

## 2018-04-04 NOTE — ED Notes (Signed)
Last use of Heroin was last Friday    Was here--  Referred to [facility]  Insurance would not cover it    Told to come back to the ED for another prescritpion

## 2018-04-04 NOTE — ED Triage Notes (Signed)
Last use of Heroin was last Friday    Was here--  Referred to [facility]  Insurance would not cover it    Told to come back to the ED for another prescritpion

## 2018-07-05 ENCOUNTER — Inpatient Hospital Stay
Admit: 2018-07-05 | Discharge: 2018-07-05 | Disposition: A | Discharge status: Home / Self Care | Payer: MEDICAID | Attending: Emergency Medicine

## 2018-07-05 DIAGNOSIS — F1123 Opioid dependence with withdrawal: Secondary | ICD-10-CM

## 2018-07-05 LAB — DRUG SCREEN, URINE
Amphetamine: NEGATIVE
Amphetamines: NEGATIVE
Barbiturates: NEGATIVE
Barbiturates: NEGATIVE
Benzodiazapines: NEGATIVE
Benzodiazepines: NEGATIVE
Cocaine: POSITIVE — AB
Cocaine: POSITIVE — AB
Marijuana: POSITIVE — AB
Marijuana: POSITIVE — AB
Methadone: NEGATIVE
Methadone: NEGATIVE
Opiates: POSITIVE — AB
Opiates: POSITIVE — AB
Phencyclidine: NEGATIVE
Phencyclidine: NEGATIVE

## 2018-07-05 LAB — POC HCG,URINE
HCG urine, QL: NEGATIVE
Pregnancy Test(Urn): NEGATIVE

## 2018-07-05 MED ORDER — ONDANSETRON HCL 4 MG TAB
4 mg | Freq: Three times a day (TID) | ORAL | Status: DC | PRN
Start: 2018-07-05 — End: 2018-07-05

## 2018-07-05 MED ORDER — CLONIDINE 0.1 MG TAB
0.1 mg | ORAL | Status: AC
Start: 2018-07-05 — End: 2018-07-05
  Administered 2018-07-05: 21:00:00 via ORAL

## 2018-07-05 MED ORDER — SUBOXONE 4 MG-1 MG SUBLINGUAL FILM
4-1 mg | ORAL_FILM | Freq: Every day | SUBLINGUAL | 0 refills | Status: AC
Start: 2018-07-05 — End: 2018-07-13

## 2018-07-05 MED FILL — CLONIDINE 0.1 MG TAB: 0.1 mg | ORAL | Qty: 1

## 2018-07-05 MED FILL — ONDANSETRON HCL 4 MG TAB: 4 mg | ORAL | Qty: 1

## 2018-07-05 NOTE — Progress Notes (Signed)
PSYCH NOTE: This is the 3rd visit for this patient with  Regard to her chief complaint of wanting to detox from her addiction to opiates via the [facility] program. The patient was assessed on March 28, 2018 and was started in the [facility] program, given the 3 day dosages of Suboxone and referred to the outpatient opiate detox services of "[facility]". Reportedly the patient returned on April 04, 2018 and reports that she was unsuccessful in getting into the [facility] program, secondary to the fact that they did not take her health insurance.on 04-04-18 the patient  Was given 3 day supply of Suboxone and referred to the [facility] and was successful in completing the program. Reportedly the patient moved to [place], [place], then became homeless and relapsed back into heroin addiction. The patient's father reportedly pick the patient up in [place], and is with patient here today. The patient is requesting a 3-4 day supply of Suboxone to help the patient maintain as she and father travel back to her parent's home [place] n [place]. The patient reports plans to get into a [facility] detox program in [place] upon arrival to [place]. The patient score herself at 40 on the "Sow" questionnaire. The nurse scored the patient a 1 on the "Cow" questionnaire. The patient denies any suicidal or homicidal ideas.  PLAN:  The patient was given the prescription for (8 films) of Suboxone 4-1 mg sublingual films and was discharged to follow-up with an addiction specialist in [place].

## 2018-07-05 NOTE — ED Notes (Signed)
4:56 PM  07/05/18     Discharge instructions given to patient (name) with verbalization of understanding. Patient accompanied by dad.  Patient discharged with the following prescriptions suboxone. Patient discharged to home (destination).      Jennifer L. Guy, RN

## 2018-07-05 NOTE — ED Provider Notes (Signed)
[facility] Care  Emergency Department Treatment Report        Patient: Shannon Acevedo Age: 27 y.o. Sex: female    Date of Birth: [birth year] Admit Date: 07/05/2018 PCP: None   MRN: [id number]  CSN: [id number]     Room: ER17/ER17 Time Dictated: 3:18 PM          Chief Complaint   I need to get off the heroin    History of Present Illness   27 y.o. female went through the [facility] in August of this year, subsequently moved to [place] but then became homeless.  She has been snorting heroin, last use 0500, and feels she is already in withdrawal.  A complicating factor is that the patient was brought here by her father, who picked her up in [place] and wants to take her back home to [place] directly from here.  She would therefore not be participating in the actual [facility] program.    Review of Systems   ROS  Constitutional:  No fever, chills, or weight loss. Normal appetite  Eyes: No visual complaints.  ENT: No sore throat, runny nose or ear pain.  Heme/Lymph: No easy bruising or lymph node swelling  Respiratory: No cough, dyspnea or wheezing.  Cardiovascular: No chest pain, pressure, palpitations, tightness or heaviness.  Gastrointestinal: No diarrhea or abdominal pain.  No melena or bright red blood per rectum  Genitourinary: No dysuria, frequency, or urgency.  No hematuria  Musculoskeletal: No joint pain, swelling, or extremity pain.  Integumentary: No rashes or other skin lesions  Neurological: No headaches, sensory or motor symptoms.  Denies complaints in all other systems    Past Medical/Surgical History     Past Medical History:   Diagnosis Date   ??? Abuse    ??? Anemia NEC    ??? Anxiety    ??? Arrhythmia    ??? Bipolar 1 disorder, depressed (HCC)    ??? Depression    ??? Depression    ??? Trauma      Past Surgical History:   Procedure Laterality Date   ??? HX GYN         Social History     Social History     Socioeconomic History   ??? Marital status: SINGLE     Spouse name: Not on file    ??? Number of children: Not on file   ??? Years of education: Not on file   ??? Highest education level: Not on file   Occupational History   ??? Not on file   Social Needs   ??? Financial resource strain: Not on file   ??? Food insecurity:     Worry: Not on file     Inability: Not on file   ??? Transportation needs:     Medical: Not on file     Non-medical: Not on file   Tobacco Use   ??? Smoking status: Current Every Day Smoker     Packs/day: 1.00     Years: 2.00     Pack years: 2.00   ??? Smokeless tobacco: Never Used   Substance and Sexual Activity   ??? Alcohol use: No   ??? Drug use: Yes     Types: Heroin, Cocaine, Marijuana   ??? Sexual activity: Never   Lifestyle   ??? Physical activity:     Days per week: Not on file     Minutes per session: Not on file   ??? Stress: Not on file   Relationships   ???  Social connections:     Talks on phone: Not on file     Gets together: Not on file     Attends religious service: Not on file     Active member of club or organization: Not on file     Attends meetings of clubs or organizations: Not on file     Relationship status: Not on file   ??? Intimate partner violence:     Fear of current or ex partner: Not on file     Emotionally abused: Not on file     Physically abused: Not on file     Forced sexual activity: Not on file   Other Topics Concern   ??? Not on file   Social History Narrative   ??? Not on file       Family History     Family History   Problem Relation Age of Onset   ??? Asthma Mother    ??? Alcohol abuse Father    ??? Psychiatric Disorder Father    ??? Cancer Paternal Grandmother    ??? Heart Disease Paternal Grandfather        Current Medications     Prior to Admission Medications   Prescriptions Last Dose Informant Patient Reported? Taking?   naloxone 2 mg/actuation spry   No No   Sig: Use 1 spray intranasally, then discard. Repeat with new spray every 2 min as needed for opioid overdose symptoms, alternating nostrils.  Indications: Decrease in Rate & Depth of Breathing due to Opioid Drug       Facility-Administered Medications: None       Allergies     Allergies   Allergen Reactions   ??? Penicillins Anaphylaxis       Physical Exam     ED Triage Vitals   ED Encounter Vitals Group      BP 07/05/18 1444 129/84      Pulse (Heart Rate) 07/05/18 1444 (!) 103      Resp Rate 07/05/18 1444 16      Temp 07/05/18 1444 99.4 ??F (37.4 ??C)      Temp src --       O2 Sat (%) 07/05/18 1444 100 %      Weight --       Height 07/05/18 1435 5\' 6"      Physical Exam  Vitals signs and nursing note reviewed.   Constitutional:       Appearance: Normal appearance.   HENT:      Head: Normocephalic and atraumatic.      Mouth/Throat:      Mouth: Mucous membranes are moist.      Pharynx: Oropharynx is clear.   Eyes:      Extraocular Movements: Extraocular movements intact.      Conjunctiva/sclera: Conjunctivae normal.      Pupils: Pupils are equal, round, and reactive to light.   Neck:      Musculoskeletal: Normal range of motion.   Cardiovascular:      Rate and Rhythm: Regular rhythm. Tachycardia present.   Abdominal:      Palpations: Abdomen is soft.      Tenderness: There is no tenderness.   Musculoskeletal: Normal range of motion.   Skin:     General: Skin is warm and dry.   Neurological:      General: No focal deficit present.      Mental Status: She is alert and oriented to person, place, and time.   Psychiatric:  Mood and Affect: Mood normal.         Behavior: Behavior normal.            Impression and Management Plan   Although the patient will not be able to stay for the [facility] program, I am comfortable providing her a Suboxone prescription to get her back to [place] and to treatment there.  [place] cows here to see if we need to treat her now.    ED Course      Patient's cows score is only 1, although she rates her score as 40.  She has no objective symptoms, I will not treat her here.  She is given the prescription for 8 4???1 sublingual films and is discharged to follow-up  with an addiction specialist in [place].    Final Diagnosis       ICD-10-CM ICD-9-CM   1. Opioid dependence with withdrawal (HCC) F11.23 292.0     304.00         Disposition   The patient is discharged home in stable condition, with instructions to follow-up with their regular doctor.  They are advised to return immediately for any worsening or symptoms of concern.    Rolena Infanteichard L Mckena Chern, MD  July 05, 2018    My signature above authenticates this document and my orders, the final ??  diagnosis (es), discharge prescription (s), and instructions in the Epic ??  record.  If you have any questions please contact [phone].  ??  Nursing notes have been reviewed by the physician/ advanced practice ??  Clinician.    Dragon medical dictation software was used for portions of this report. Unintended transcription errors may occur.

## 2018-07-05 NOTE — ED Triage Notes (Signed)
Last use of Heroin was 5 AM    Starts feeling bad after 5-7 hours    Does not use by IV  Snorts it
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
## 2018-07-05 NOTE — Progress Notes (Signed)
 PSYCH NOTE: This is the 3rd visit for this patient with  Regard to her chief complaint of wanting to detox from her addiction to opiates via the [facility] a program. The patient was assessed on March 28, 2018 and was started in the [facility] program, given the 3 day dosages of Suboxone and referred to the outpatient opiate detox services of [facility]. Reportedly the patient returned on April 04, 2018 and reports that she was unsuccessful in getting into [facility] program, secondary to the fact that they did not take her health insurance.on 04-04-18 the patient  Was given 3 day supply of Suboxone and referred to the [facility] and was successful in completing the program. Reportedly the patient moved to [place], [place] , then became homeless and relapsed back into heroin addiction. The patient's father reportedly pick the patient up in [place], and is with patient here today. The patient is requesting a 3-4 day supply of Suboxone to help the patient maintain as she and father travel back to her parent's home [place] n [place] . The patient reports plans to get into a [facility] detox program in [place]  stat upon arrival to [place] . The patient score herself at 40 on the Sow questionnaire. The nurse scored the patient a 1 on the Cow questionnaire. The patient denies any suicidal or homicidal ideas.  PLAN:  The patient was given the prescription for (8 films) of Suboxone 4-1 mg sublingual films and was discharged to follow-up with an addiction specialist in  .

## 2018-07-05 NOTE — ED Notes (Signed)
Last use of Heroin was 5 AM    Starts feeling bad after 5-7 hours    Does not use by IV  Snorts it

## 2018-07-05 NOTE — ED Notes (Signed)
4:56 PM  07/05/18     Discharge instructions given to patient (name) with verbalization of understanding. Patient accompanied by dad.  Patient discharged with the following prescriptions suboxone. Patient discharged to home (destination).      Johny SaxJennifer L. Guy, RN

## 2019-10-25 ENCOUNTER — Encounter (HOSPITAL_COMMUNITY): Payer: Self-pay | Admitting: Obstetrics and Gynecology

## 2019-10-25 ENCOUNTER — Other Ambulatory Visit: Payer: Self-pay

## 2019-10-25 ENCOUNTER — Emergency Department (HOSPITAL_COMMUNITY)
Admission: EM | Admit: 2019-10-25 | Discharge: 2019-10-25 | Disposition: A | Discharge status: Home / Self Care | Payer: Medicaid Other | Attending: Emergency Medicine | Admitting: Emergency Medicine

## 2019-10-25 ENCOUNTER — Emergency Department (HOSPITAL_COMMUNITY): Payer: Medicaid Other

## 2019-10-25 DIAGNOSIS — R102 Pelvic and perineal pain unspecified side: Secondary | ICD-10-CM

## 2019-10-25 DIAGNOSIS — N739 Female pelvic inflammatory disease, unspecified: Secondary | ICD-10-CM | POA: Diagnosis not present

## 2019-10-25 DIAGNOSIS — F17210 Nicotine dependence, cigarettes, uncomplicated: Secondary | ICD-10-CM | POA: Diagnosis not present

## 2019-10-25 DIAGNOSIS — R198 Other specified symptoms and signs involving the digestive system and abdomen: Secondary | ICD-10-CM | POA: Diagnosis not present

## 2019-10-25 LAB — WET PREP, GENITAL
Clue Cells Wet Prep HPF POC: NONE SEEN
Sperm: NONE SEEN
Trich, Wet Prep: NONE SEEN
Yeast Wet Prep HPF POC: NONE SEEN

## 2019-10-25 LAB — CBC
HCT: 46.6 % — ABNORMAL HIGH (ref 36.0–46.0)
Hemoglobin: 14.9 g/dL (ref 12.0–15.0)
MCH: 30.2 pg (ref 26.0–34.0)
MCHC: 32 g/dL (ref 30.0–36.0)
MCV: 94.3 fL (ref 80.0–100.0)
Platelets: 311 10*3/uL (ref 150–400)
RBC: 4.94 MIL/uL (ref 3.87–5.11)
RDW: 13.5 % (ref 11.5–15.5)
WBC: 14.9 10*3/uL — ABNORMAL HIGH (ref 4.0–10.5)
nRBC: 0 % (ref 0.0–0.2)

## 2019-10-25 LAB — COMPREHENSIVE METABOLIC PANEL
ALT: 12 U/L (ref 0–44)
AST: 20 U/L (ref 15–41)
Albumin: 4 g/dL (ref 3.5–5.0)
Alkaline Phosphatase: 84 U/L (ref 38–126)
Anion gap: 11 (ref 5–15)
BUN: 15 mg/dL (ref 6–20)
CO2: 29 mmol/L (ref 22–32)
Calcium: 9.2 mg/dL (ref 8.9–10.3)
Chloride: 102 mmol/L (ref 98–111)
Creatinine, Ser: 0.71 mg/dL (ref 0.44–1.00)
GFR calc Af Amer: >60 mL/min (ref >60)
GFR calc non Af Amer: >60 mL/min (ref >60)
Glucose, Bld: 82 mg/dL (ref 70–99)
Potassium: 4.3 mmol/L (ref 3.5–5.1)
Sodium: 142 mmol/L (ref 135–145)
Total Bilirubin: 0.3 mg/dL (ref 0.3–1.2)
Total Protein: 8.4 g/dL — ABNORMAL HIGH (ref 6.5–8.1)

## 2019-10-25 LAB — URINALYSIS, ROUTINE W REFLEX MICROSCOPIC
Bilirubin Urine: NEGATIVE
Glucose, UA: NEGATIVE mg/dL
Hgb urine dipstick: NEGATIVE
Ketones, ur: NEGATIVE mg/dL
Nitrite: NEGATIVE
Protein, ur: NEGATIVE mg/dL
Specific Gravity, Urine: 1.021 (ref 1.005–1.030)
pH: 6 (ref 5.0–8.0)

## 2019-10-25 LAB — I-STAT BETA HCG BLOOD, ED (MC, WL, AP ONLY): I-stat hCG, quantitative: <5 m[IU]/mL (ref <5)

## 2019-10-25 LAB — HIV ANTIBODY (ROUTINE TESTING W REFLEX): HIV Screen 4th Generation wRfx: NONREACTIVE

## 2019-10-25 LAB — LIPASE, BLOOD: Lipase: 65 U/L — ABNORMAL HIGH (ref 11–51)

## 2019-10-25 MED ORDER — SODIUM CHLORIDE 0.9% FLUSH: 3.0000 mL | Freq: Once | INTRAVENOUS | Status: DC

## 2019-10-25 MED ORDER — ONDANSETRON HCL 4 MG/2ML IJ SOLN
4.0000 mg | Freq: Once | INTRAMUSCULAR | Status: AC
  Administered 2019-10-25: 10:00:00 4 mg via INTRAVENOUS
  Filled 2019-10-25: qty 2

## 2019-10-25 MED ORDER — NAPROXEN 500 MG PO TABS: 500.0000 mg | ORAL_TABLET | Freq: Two times a day (BID) | ORAL | 0 refills | Status: DC

## 2019-10-25 MED ORDER — GENTAMICIN SULFATE 40 MG/ML IJ SOLN
240.0000 mg | Freq: Once | INTRAMUSCULAR | Status: AC
  Administered 2019-10-25: 240 mg via INTRAMUSCULAR
  Filled 2019-10-25: qty 6

## 2019-10-25 MED ORDER — HYDROMORPHONE HCL 1 MG/ML IJ SOLN
0.5000 mg | Freq: Once | INTRAMUSCULAR | Status: AC
  Administered 2019-10-25: 0.5 mg via INTRAVENOUS
  Filled 2019-10-25: qty 1

## 2019-10-25 MED ORDER — METRONIDAZOLE 500 MG PO TABS
500.0000 mg | ORAL_TABLET | Freq: Once | ORAL | Status: AC
  Administered 2019-10-25: 500 mg via ORAL
  Filled 2019-10-25: qty 1

## 2019-10-25 MED ORDER — METRONIDAZOLE 500 MG PO TABS: 500.0000 mg | ORAL_TABLET | Freq: Two times a day (BID) | ORAL | 0 refills | Status: DC

## 2019-10-25 MED ORDER — OXYCODONE HCL 5 MG PO CAPS: 5.0000 mg | ORAL_CAPSULE | ORAL | 0 refills | Status: DC | PRN

## 2019-10-25 MED ORDER — DOXYCYCLINE HYCLATE 100 MG PO TABS
100.0000 mg | ORAL_TABLET | Freq: Once | ORAL | Status: AC
  Administered 2019-10-25: 100 mg via ORAL
  Filled 2019-10-25: qty 1

## 2019-10-25 MED ORDER — DOXYCYCLINE HYCLATE 100 MG PO CAPS: 100.0000 mg | ORAL_CAPSULE | Freq: Two times a day (BID) | ORAL | 0 refills | Status: DC

## 2019-10-25 NOTE — ED Triage Notes (Signed)
Patient reports painful intercourse, yellow pelvic discharge, and lower abdominal and pelvic pain.  Patient reports last period 09/19/2019.

## 2019-10-25 NOTE — ED Provider Notes (Signed)
[name]   CSN: [id number] Arrival date & time: 10/25/19  4097     History Chief Complaint  Patient presents with  . Pelvic Pain  . Diarrhea    Sheri Woodward is a 29 y.o. female.  Patient presents to the emergency department with complaint of vaginal discharge, lower abdominal pain worse in the left pelvis area, diarrhea.  Patient states that the symptoms have been going on for about a month.  She waited to get checked due to not having insurance.  Patient states that her last menstrual period was on 09/19/2019 and was lighter than normal for her.  Typically she has a moderately heavy period and this was very light.  She is sexually active and is concerned about sexual transmitted infections.  The discharge is yellow in color.  She denies irritative urinary symptoms.  She denies fever or chills.  No chest pain or shortness of breath.  No lower extremity swelling.        History reviewed. No pertinent past medical history.  There are no problems to display for this patient.   History reviewed. No pertinent surgical history.   OB History    Gravida  2   Para      Term      Preterm      AB  1   Living  1     SAB      TAB  1   Ectopic      Multiple      Live Births              No family history on file.  Social History   Tobacco Use  . Smoking status: Current Every Day Smoker    Packs/day: 1.00    Years: 15.00    Pack years: 15.00    Types: Cigarettes  . Smokeless tobacco: Never Used  Substance Use Topics  . Alcohol use: Yes    Alcohol/week: 5.0 standard drinks    Types: 3 Glasses of wine, 2 Cans of beer per week    Comment: 4-5  . Drug use: Yes    Types: Marijuana    Home Medications Prior to Admission medications   Not on File    Allergies    Penicillins  Review of Systems   Review of Systems  Constitutional: Negative for fever.  HENT: Negative for rhinorrhea and sore  throat.   Eyes: Negative for redness.  Respiratory: Negative for cough.   Cardiovascular: Negative for chest pain.  Gastrointestinal: Positive for diarrhea. Negative for abdominal pain, nausea and vomiting.  Genitourinary: Positive for pelvic pain, vaginal discharge and vaginal pain. Negative for dysuria and vaginal bleeding.  Musculoskeletal: Negative for myalgias.  Skin: Negative for rash.  Neurological: Negative for headaches.    Physical Exam Updated Vital Signs BP 127/88 (BP Location: Left Arm)   Pulse (!) 115   Temp 98.7 F (37.1 C) (Oral)   Resp 18   SpO2 100%   Physical Exam Vitals and nursing note reviewed. Exam conducted with a chaperone present.  Constitutional:      Appearance: She is well-developed.  HENT:     Head: Normocephalic and atraumatic.  Eyes:     General:        Right eye: No discharge.        Left eye: No discharge.     Conjunctiva/sclera: Conjunctivae normal.  Cardiovascular:     Rate and Rhythm: Normal rate and regular  rhythm.     Heart sounds: Normal heart sounds.  Pulmonary:     Effort: Pulmonary effort is normal.     Breath sounds: Normal breath sounds.  Abdominal:     Palpations: Abdomen is soft.     Tenderness: There is no abdominal tenderness.  Genitourinary:    Pubic Area: No rash.      Vagina: Normal.     Cervix: Cervical motion tenderness and discharge (Yellow-green, moderate) present. No cervical bleeding.     Uterus: Tender.      Adnexa:        Right: Tenderness present. No mass or fullness.         Left: Tenderness and fullness present. No mass.       Comments: Patient with minimal tenderness in the right adnexa, significant tenderness and fullness in the left adnexa. Musculoskeletal:     Cervical back: Normal range of motion and neck supple.  Skin:    General: Skin is warm and dry.  Neurological:     Mental Status: She is alert.     ED Results / Procedures / Treatments   Labs (all labs ordered are listed, but only  abnormal results are displayed) Labs Reviewed  WET PREP, GENITAL - Abnormal; Notable for the following components:      Result Value   WBC, Wet Prep HPF POC MODERATE (*)    All other components within normal limits  LIPASE, BLOOD - Abnormal; Notable for the following components:   Lipase 65 (*)    All other components within normal limits  COMPREHENSIVE METABOLIC PANEL - Abnormal; Notable for the following components:   Total Protein 8.4 (*)    All other components within normal limits  CBC - Abnormal; Notable for the following components:   WBC 14.9 (*)    HCT 46.6 (*)    All other components within normal limits  URINALYSIS, ROUTINE W REFLEX MICROSCOPIC - Abnormal; Notable for the following components:   APPearance CLOUDY (*)    Leukocytes,Ua MODERATE (*)    Bacteria, UA RARE (*)    All other components within normal limits  HIV ANTIBODY (ROUTINE TESTING W REFLEX)  RPR  I-STAT BETA HCG BLOOD, ED ([facility], [facility], [facility] ONLY)  GC/CHLAMYDIA PROBE AMP ([facility]) NOT AT [facility]    EKG None  Radiology US PELVIC COMPLETE W TRANSVAGINAL AND TORSION R/O  Result Date: 10/25/2019 CLINICAL DATA:  Left lower quadrant pain. EXAM: TRANSABDOMINAL AND TRANSVAGINAL ULTRASOUND OF PELVIS DOPPLER ULTRASOUND OF OVARIES TECHNIQUE: Both transabdominal and transvaginal ultrasound examinations of the pelvis were performed. Transabdominal technique was performed for global imaging of the pelvis including uterus, ovaries, adnexal regions, and pelvic cul-de-sac. It was necessary to proceed with endovaginal exam following the transabdominal exam to visualize the endometrium and ovaries. Color and duplex Doppler ultrasound was utilized to evaluate blood flow to the ovaries. COMPARISON:  None. FINDINGS: Uterus Measurements: 7 x 3.9 x 6.4 cm = volume: 91 mL. No fibroids or other mass visualized. Endometrium Thickness: 4 mm.  No focal abnormality visualized. Right ovary Measurements: 4.1 x 3.2 x 4.5 cm = volume: 31 mL. Normal  appearance/no adnexal mass. Left ovary The left ovary measures 4.3 x 2.7 x 3.1 cm with an 18 cc volume transabdominally. Endovaginally, there is a 5.3 x 4.9 x 4.4 cm complex mass in the left adnexa which is thought to arise within the ovary. Based on imaging, it is unclear whether this represents a 3.3 cm hypoechoic mass with a rind of  normal ovarian tissue or a 5.3 cm complex cystic mass with solid and cystic components. Pulsed Doppler evaluation of both ovaries demonstrates normal low-resistance arterial and venous waveforms. Other findings There is a small amount of free fluid in the pelvis. IMPRESSION: There is a 5.3 x 4.9 x 4.4 cm structure in the left adnexa thought to be the left ovary. It is unclear whether this represents a 3.3 cm hypoechoic mass with a rind of normal ovarian tissue or a 5.3 cm complex cystic mass with solid and cystic components. Under real-time imaging, there did appear to be at least a rind of normal appearing ovarian tissue. The findings are not specific. Given the patient's age, this could represent an endometrioma or hemorrhagic cyst within the left ovary. A neoplasm is not excluded on this single study. There is no evidence of torsion at this time. There are 2 potential ways to follow this mass. If the patient's symptoms are tolerable, a follow-up ultrasound in 6-8 weeks could be performed for follow-up. If more definitive imaging is needed today, an MRI could be performed. Electronically Signed   By: Dorise Bullion III M.D   On: 10/25/2019 11:06    Procedures Procedures (including critical care time)  Medications Ordered in ED Medications  sodium chloride flush (NS) 0.9 % injection 3 mL (3 mLs Intravenous Not Given 10/25/19 1230)  HYDROmorphone (DILAUDID) injection 0.5 mg (0.5 mg Intravenous Given 10/25/19 0956)  ondansetron (ZOFRAN) injection 4 mg (4 mg Intravenous Given 10/25/19 0956)  HYDROmorphone (DILAUDID) injection 0.5 mg (0.5 mg Intravenous Given 10/25/19 1152)    gentamicin (GARAMYCIN) injection 240 mg (240 mg Intramuscular Given 10/25/19 1202)  doxycycline (VIBRA-TABS) tablet 100 mg (100 mg Oral Given 10/25/19 1201)  metroNIDAZOLE (FLAGYL) tablet 500 mg (500 mg Oral Given 10/25/19 1200)    ED Course  I have reviewed the triage vital signs and the nursing notes.  Pertinent labs & imaging results that were available during my care of the patient were reviewed by me and considered in my medical decision making (see chart for details).  Patient seen and examined. Work-up initiated. Medications ordered.   Vital signs reviewed and are as follows: BP 127/88 (BP Location: Left Arm)   Pulse (!) 115   Temp 98.7 F (37.1 C) (Oral)   Resp 18   SpO2 100%   9:13 AM patient will need pelvic ultrasound.  Awaiting pregnancy test.  Pregnancy was negative.   I discussed case with Dr. Zenia Resides. I spoke with Dr. Rip Harbour with OB/GYN.  He requested that patient be started on doxycycline and metronidazole and be given follow-up with women's health clinic in 7 days.  Patient with anaphylaxis to penicillin in the past.  She was treated with IM gentamicin for gonococcal coverage.  I discussed antibiotic plan with pharmacist who is in agreement.  Patient updated on this plan and she is in agreement.  She will be discharged to home with prescription for doxycycline, metronidazole, oxycodone, and naproxen for pain.  Reviewed [facility] substance database.  Patient has been on Suboxone, most recently in 2019.  She does have a acutely painful condition.  Oxycodone 5 mg #8 tabs prescribed for symptom control as well as NSAIDs.  2:13 PM The patient was urged to return to the Emergency Department immediately with worsening of current symptoms, worsening abdominal pain, persistent vomiting, blood noted in stools, fever, or any other concerns. The patient verbalized understanding.      MDM Rules/Calculators/A&P  Patient with clinical pelvic inflammatory  disease and left adnexal pain and fullness.  Negative pregnancy.  Ultrasound demonstrates enlarged left ovary with mass versus fluid collection.  Discussed with on-call OB/GYN.  Patient started on appropriate antibiotic therapy.  Follow-up plan arranged.  Patient is in agreement and seems reliable.  Return instructions as above.  Patient is tolerating orals and not vomiting.  She is not febrile.  She appears nontoxic and I doubt sepsis at this point.  Final Clinical Impression(s) / ED Diagnoses Final diagnoses:  Pelvic inflammatory disease  Left pelvic adnexal fluid collection    Rx / DC Orders ED Discharge Orders         Ordered    doxycycline (VIBRAMYCIN) 100 MG capsule  2 times daily     10/25/19 1327    metroNIDAZOLE (FLAGYL) 500 MG tablet  2 times daily     10/25/19 1327    oxycodone (OXY-IR) 5 MG capsule  Every 4 hours PRN     10/25/19 1327    naproxen (NAPROSYN) 500 MG tablet  2 times daily     10/25/19 1327           Renne Crigler, PA-C 10/25/19 1415    Lorre Nick, MD 10/25/19 1429

## 2019-10-25 NOTE — Discharge Instructions (Signed)
Please read and follow all provided instructions.  Your diagnoses today include:  1. Pelvic inflammatory disease   2. Pelvic pain   3. Left pelvic adnexal fluid collection    Tests performed today include:  Blood counts and electrolytes  Ultrasound -shows an enlarged left ovary with possible collection of fluid  Pregnancy test -negative  Vital signs. See below for your results today.   Medications prescribed:   Oxycodone - narcotic pain medication  DO NOT drive or perform any activities that require you to be awake and alert because this medicine can make you drowsy.    Naproxen - anti-inflammatory pain medication  Do not exceed 500mg  naproxen every 12 hours, take with food  You have been prescribed an anti-inflammatory medication or NSAID. Take with food. Take smallest effective dose for the shortest duration needed for your pain. Stop taking if you experience stomach pain or vomiting.    Doxycycline - antibiotic  You have been prescribed an antibiotic medicine: take the entire course of medicine even if you are feeling better. Stopping early can cause the antibiotic not to work.   Metronidazole - antibiotic  You have been prescribed an antibiotic medicine: take the entire course of medicine even if you are feeling better. Stopping early can cause the antibiotic not to work. Do not drink alcohol when taking this medication.   Take any prescribed medications only as directed.  Home care instructions:  Follow any educational materials contained in this packet.  BE VERY CAREFUL not to take multiple medicines containing Tylenol (also called acetaminophen). Doing so can lead to an overdose which can damage your liver and cause liver failure and possibly death.   Follow-up instructions: Call the OB/GYN clinic today or tomorrow and schedule an appointment for 7 days.  Return instructions:   Please return to the Emergency Department if you experience worsening symptoms.     Please return if you have any other emergent concerns.  Additional Information:  Your vital signs today were: BP 91/67   Pulse 65   Temp 98.7 F (37.1 C) (Oral)   Resp 18   SpO2 99%  If your blood pressure (BP) was elevated above 135/85 this visit, please have this repeated by your doctor within one month. --------------

## 2019-10-26 LAB — RPR: RPR Ser Ql: NONREACTIVE

## 2019-10-27 LAB — GC/CHLAMYDIA PROBE AMP (~~LOC~~) NOT AT ARMC
Chlamydia: POSITIVE — AB
Neisseria Gonorrhea: NEGATIVE

## 2019-12-01 ENCOUNTER — Other Ambulatory Visit: Payer: Self-pay

## 2019-12-01 ENCOUNTER — Ambulatory Visit (INDEPENDENT_AMBULATORY_CARE_PROVIDER_SITE_OTHER): Payer: Medicaid Other | Admitting: Obstetrics and Gynecology

## 2019-12-01 ENCOUNTER — Encounter: Payer: Self-pay | Admitting: Obstetrics and Gynecology

## 2019-12-01 ENCOUNTER — Other Ambulatory Visit (HOSPITAL_COMMUNITY)
Admission: RE | Admit: 2019-12-01 | Discharge: 2019-12-01 | Disposition: A | Discharge status: Home / Self Care | Payer: Medicaid Other | Source: Ambulatory Visit | Attending: Obstetrics and Gynecology | Admitting: Obstetrics and Gynecology

## 2019-12-01 VITALS — BP 115/69 | HR 65 | Wt 128.9 lb

## 2019-12-01 DIAGNOSIS — Z1151 Encounter for screening for human papillomavirus (HPV): Secondary | ICD-10-CM

## 2019-12-01 DIAGNOSIS — N83202 Unspecified ovarian cyst, left side: Secondary | ICD-10-CM | POA: Diagnosis not present

## 2019-12-01 DIAGNOSIS — Z202 Contact with and (suspected) exposure to infections with a predominantly sexual mode of transmission: Secondary | ICD-10-CM

## 2019-12-01 DIAGNOSIS — Z01411 Encounter for gynecological examination (general) (routine) with abnormal findings: Secondary | ICD-10-CM | POA: Diagnosis not present

## 2019-12-01 DIAGNOSIS — Z01419 Encounter for gynecological examination (general) (routine) without abnormal findings: Secondary | ICD-10-CM | POA: Insufficient documentation

## 2019-12-01 DIAGNOSIS — R87610 Atypical squamous cells of undetermined significance on cytologic smear of cervix (ASC-US): Secondary | ICD-10-CM

## 2019-12-01 DIAGNOSIS — N83209 Unspecified ovarian cyst, unspecified side: Secondary | ICD-10-CM | POA: Insufficient documentation

## 2019-12-01 DIAGNOSIS — R87810 Cervical high risk human papillomavirus (HPV) DNA test positive: Secondary | ICD-10-CM | POA: Diagnosis not present

## 2019-12-01 DIAGNOSIS — Z309 Encounter for contraceptive management, unspecified: Secondary | ICD-10-CM | POA: Insufficient documentation

## 2019-12-01 DIAGNOSIS — Z720 Tobacco use: Secondary | ICD-10-CM

## 2019-12-01 DIAGNOSIS — Z30011 Encounter for initial prescription of contraceptive pills: Secondary | ICD-10-CM

## 2019-12-01 MED ORDER — LO LOESTRIN FE 1 MG-10 MCG / 10 MCG PO TABS: 1.0000 | ORAL_TABLET | Freq: Every day | ORAL | 3 refills | Status: AC

## 2019-12-01 NOTE — Progress Notes (Signed)
Sheri Woodward is a 29 y.o. G5P0011 female here for a routine annual gynecologic exam plus ER f/u from 10/25/19. Dx with PID and left ovarian cyst at that time. Has completed Tx. Desires contraception. Has used OCP's in the past. Sexual active. Last pap 2 yrs ago ? Abnormal. Cycles are monthly and light.    Gynecologic History Patient's last menstrual period was 11/17/2019. Contraception: condoms Last Pap: 2019. Results were: abnormal Last mammogram: NA.   Obstetric History OB History  Gravida Para Term Preterm AB Living  2       1 1   SAB TAB Ectopic Multiple Live Births    1          # Outcome Date GA Lbr Len/2nd Weight Sex Delivery Anes PTL Lv  2 Gravida           1 TAB             History reviewed. No pertinent past medical history.  History reviewed. No pertinent surgical history.  Current Outpatient Medications on File Prior to Visit  Medication Sig Dispense Refill  . naproxen (NAPROSYN) 500 MG tablet Take 1 tablet (500 mg total) by mouth 2 (two) times daily. (Patient not taking: Reported on 12/01/2019) 30 tablet 0   No current facility-administered medications on file prior to visit.    Allergies  Allergen Reactions  . Penicillins     Anaphylaxis     Social History   Socioeconomic History  . Marital status: Single    Spouse name: Not on file  . Number of children: Not on file  . Years of education: Not on file  . Highest education level: Not on file  Occupational History  . Not on file  Tobacco Use  . Smoking status: Current Every Day Smoker    Packs/day: 1.00    Years: 15.00    Pack years: 15.00    Types: Cigarettes  . Smokeless tobacco: Never Used  Substance and Sexual Activity  . Alcohol use: Yes    Alcohol/week: 5.0 standard drinks    Types: 3 Glasses of wine, 2 Cans of beer per week    Comment: 4-5  . Drug use: Yes    Types: Marijuana  . Sexual activity: Yes    Birth control/protection: None  Other Topics Concern  . Not on file  Social  History Narrative  . Not on file   Social Determinants of Health   Financial Resource Strain:   . Difficulty of Paying Living Expenses:   Food Insecurity:   . Worried About 12/03/2019 in the Last Year:   . Programme researcher, broadcasting/film/video in the Last Year:   Transportation Needs:   . Barista (Medical):   Freight forwarder Lack of Transportation (Non-Medical):   Physical Activity:   . Days of Exercise per Week:   . Minutes of Exercise per Session:   Stress:   . Feeling of Stress :   Social Connections:   . Frequency of Communication with Friends and Family:   . Frequency of Social Gatherings with Friends and Family:   . Attends Religious Services:   . Active Member of Clubs or Organizations:   . Attends [employer] [employer] Meetings:   Banker Marital Status:   Intimate Partner Violence:   . Fear of Current or Ex-Partner:   . Emotionally Abused:   [place] Physically Abused:   . Sexually Abused:     History reviewed. No pertinent family history.  The following  portions of the patient's history were reviewed and updated as appropriate: allergies, current medications, past family history, past medical history, past social history, past surgical history and problem list.  Review of Systems Pertinent items noted in HPI and remainder of comprehensive ROS otherwise negative.   Objective:  BP 115/69   Pulse 65   Wt 128 lb 14.4 oz (58.5 kg)   LMP 11/17/2019  CONSTITUTIONAL: Well-developed, well-nourished female in no acute distress.  HENT:  Normocephalic, atraumatic, External right and left ear normal. Oropharynx is clear and moist EYES: Conjunctivae and EOM are normal. Pupils are equal, round, and reactive to light. No scleral icterus.  NECK: Normal range of motion, supple, no masses.  Normal thyroid.  SKIN: Skin is warm and dry. No rash noted. Not diaphoretic. No erythema. No pallor. [place]: Alert and oriented to person, place, and time. Normal reflexes, muscle tone coordination. No cranial  nerve deficit noted. PSYCHIATRIC: Normal mood and affect. Normal behavior. Normal judgment and thought content. CARDIOVASCULAR: Normal heart rate noted, regular rhythm RESPIRATORY: Clear to auscultation bilaterally. Effort and breath sounds normal, no problems with respiration noted. BREASTS: deferred ABDOMEN: Soft, normal bowel sounds, no distention noted.  No tenderness, rebound or guarding.  PELVIC: Normal appearing external genitalia; normal appearing vaginal mucosa and cervix.  No abnormal discharge noted.  Pap smear obtained.  Normal uterine size, no other palpable masses, no uterine or adnexal tenderness. MUSCULOSKELETAL: Normal range of motion. No tenderness.  No cyanosis, clubbing, or edema.  2+ distal pulses.   Assessment:  Annual gynecologic examination with pap smear   STD exposure  Contraceptive management Left ovarian cyst  Plan:  Will follow up results of pap smear and manage accordingly. OCP's reviewed with pt. U/R/B and back up method reviewed with pt. Will check STD's today and check f/u U/S.  Routine preventative health maintenance measures emphasized. Please refer to After Visit Summary for other counseling recommendations.  F/U per test results and or in 4 months   Chancy Milroy, MD, [place] Attending [place] for [facility], [place]

## 2019-12-01 NOTE — Patient Instructions (Signed)
Health Maintenance, Female Adopting a healthy lifestyle and getting preventive care are important in promoting health and wellness. Ask your health care provider about:  The right schedule for you to have regular tests and exams.  Things you can do on your own to prevent diseases and keep yourself healthy. What should I know about diet, weight, and exercise? Eat a healthy diet   Eat a diet that includes plenty of vegetables, fruits, low-fat dairy products, and lean protein.  Do not eat a lot of foods that are high in solid fats, added sugars, or sodium. Maintain a healthy weight Body mass index (BMI) is used to identify weight problems. It estimates body fat based on height and weight. Your health care provider can help determine your BMI and help you achieve or maintain a healthy weight. Get regular exercise Get regular exercise. This is one of the most important things you can do for your health. Most adults should:  Exercise for at least 150 minutes each week. The exercise should increase your heart rate and make you sweat (moderate-intensity exercise).  Do strengthening exercises at least twice a week. This is in addition to the moderate-intensity exercise.  Spend less time sitting. Even light physical activity can be beneficial. Watch cholesterol and blood lipids Have your blood tested for lipids and cholesterol at 28 years of age, then have this test every 5 years. Have your cholesterol levels checked more often if:  Your lipid or cholesterol levels are high.  You are older than 28 years of age.  You are at high risk for heart disease. What should I know about cancer screening? Depending on your health history and family history, you may need to have cancer screening at various ages. This may include screening for:  Breast cancer.  Cervical cancer.  Colorectal cancer.  Skin cancer.  Lung cancer. What should I know about heart disease, diabetes, and high blood  pressure? Blood pressure and heart disease  High blood pressure causes heart disease and increases the risk of stroke. This is more likely to develop in people who have high blood pressure readings, are of African descent, or are overweight.  Have your blood pressure checked: ? Every 3-5 years if you are 18-39 years of age. ? Every year if you are 40 years old or older. Diabetes Have regular diabetes screenings. This checks your fasting blood sugar level. Have the screening done:  Once every three years after age 40 if you are at a normal weight and have a low risk for diabetes.  More often and at a younger age if you are overweight or have a high risk for diabetes. What should I know about preventing infection? Hepatitis B If you have a higher risk for hepatitis B, you should be screened for this virus. Talk with your health care provider to find out if you are at risk for hepatitis B infection. Hepatitis C Testing is recommended for:  Everyone born from 1945 through 1965.  Anyone with known risk factors for hepatitis C. Sexually transmitted infections (STIs)  Get screened for STIs, including gonorrhea and chlamydia, if: ? You are sexually active and are younger than 28 years of age. ? You are older than 28 years of age and your health care provider tells you that you are at risk for this type of infection. ? Your sexual activity has changed since you were last screened, and you are at increased risk for chlamydia or gonorrhea. Ask your health care provider if   you are at risk.  Ask your health care provider about whether you are at high risk for HIV. Your health care provider may recommend a prescription medicine to help prevent HIV infection. If you choose to take medicine to prevent HIV, you should first get tested for HIV. You should then be tested every 3 months for as long as you are taking the medicine. Pregnancy  If you are about to stop having your period (premenopausal) and  you may become pregnant, seek counseling before you get pregnant.  Take 400 to 800 micrograms (mcg) of folic acid every day if you become pregnant.  Ask for birth control (contraception) if you want to prevent pregnancy. Osteoporosis and menopause Osteoporosis is a disease in which the bones lose minerals and strength with aging. This can result in bone fractures. If you are 65 years old or older, or if you are at risk for osteoporosis and fractures, ask your health care provider if you should:  Be screened for bone loss.  Take a calcium or vitamin D supplement to lower your risk of fractures.  Be given hormone replacement therapy (HRT) to treat symptoms of menopause. Follow these instructions at home: Lifestyle  Do not use any products that contain nicotine or tobacco, such as cigarettes, e-cigarettes, and chewing tobacco. If you need help quitting, ask your health care provider.  Do not use street drugs.  Do not share needles.  Ask your health care provider for help if you need support or information about quitting drugs. Alcohol use  Do not drink alcohol if: ? Your health care provider tells you not to drink. ? You are pregnant, may be pregnant, or are planning to become pregnant.  If you drink alcohol: ? Limit how much you use to 0-1 drink a day. ? Limit intake if you are breastfeeding.  Be aware of how much alcohol is in your drink. In the [place] one drink equals one 12 oz bottle of beer (355 mL), one 5 oz glass of wine (148 mL), or one 1 oz glass of hard liquor (44 mL). General instructions  Schedule regular health, dental, and eye exams.  Stay current with your vaccines.  Tell your health care provider if: ? You often feel depressed. ? You have ever been abused or do not feel safe at home. Summary  Adopting a healthy lifestyle and getting preventive care are important in promoting health and wellness.  Follow your health care provider's instructions about healthy  diet, exercising, and getting tested or screened for diseases.  Follow your health care provider's instructions on monitoring your cholesterol and blood pressure. This information is not intended to replace advice given to you by your health care provider. Make sure you discuss any questions you have with your health care provider. Document Revised: 07/16/2018 Document Reviewed: 07/16/2018 Elsevier Patient Education  2020 Elsevier Inc.  

## 2019-12-04 LAB — CYTOLOGY - PAP
Chlamydia: NEGATIVE
Comment: NEGATIVE
Comment: NEGATIVE
Comment: NEGATIVE
Comment: NORMAL
Diagnosis: UNDETERMINED — AB
High risk HPV: POSITIVE — AB
Neisseria Gonorrhea: NEGATIVE
Trichomonas: NEGATIVE

## 2019-12-08 ENCOUNTER — Telehealth: Payer: Self-pay

## 2019-12-08 NOTE — Telephone Encounter (Addendum)
-----   Message from Hermina Staggers, MD sent at 12/04/2019  4:25 PM EDT ----- Please schedule pt for a colposcopy.  Thanks [employer] pt and informed her the results of pap smear and the need for a colposcopy.  I also explained to the pt what a colposcopy is and that we have scheduled her for an appt on 01/13/20 @ 0815.  Pt verbalized understanding.   Addison Naegeli, RN

## 2019-12-14 ENCOUNTER — Encounter: Payer: Self-pay | Admitting: *Deleted

## 2019-12-14 ENCOUNTER — Telehealth: Payer: Self-pay | Admitting: *Deleted

## 2019-12-14 NOTE — Telephone Encounter (Signed)
Was asked to give clinical information for prior auth for [place]  today. Was told would be sent to medical director. Received a call back it was approved 12/15/19- 12/1020. Legrand Como

## 2019-12-15 ENCOUNTER — Ambulatory Visit (HOSPITAL_COMMUNITY)
Admission: RE | Admit: 2019-12-15 | Discharge: 2019-12-15 | Disposition: A | Discharge status: Home / Self Care | Payer: Medicaid Other | Source: Ambulatory Visit | Attending: Obstetrics and Gynecology | Admitting: Obstetrics and Gynecology

## 2019-12-15 ENCOUNTER — Other Ambulatory Visit: Payer: Self-pay

## 2019-12-15 DIAGNOSIS — N83202 Unspecified ovarian cyst, left side: Secondary | ICD-10-CM | POA: Insufficient documentation

## 2019-12-16 ENCOUNTER — Telehealth: Payer: Self-pay

## 2019-12-16 NOTE — Telephone Encounter (Signed)
Ray from [facility] called to give Pts [place] report, left message on Nurse VM today at 9;10A.

## 2019-12-16 NOTE — Telephone Encounter (Signed)
[facility] and informed them that we already have the US Pelvic Image already.

## 2019-12-18 ENCOUNTER — Other Ambulatory Visit: Payer: Self-pay | Admitting: Obstetrics and Gynecology

## 2019-12-18 ENCOUNTER — Telehealth (INDEPENDENT_AMBULATORY_CARE_PROVIDER_SITE_OTHER): Payer: Medicaid Other | Admitting: Lactation Services

## 2019-12-18 DIAGNOSIS — N83209 Unspecified ovarian cyst, unspecified side: Secondary | ICD-10-CM

## 2019-12-18 DIAGNOSIS — R1907 Generalized intra-abdominal and pelvic swelling, mass and lump: Secondary | ICD-10-CM

## 2019-12-18 NOTE — Telephone Encounter (Signed)
Called patient to let her know of [place] results and that a Pelvic US has been scheduled for 5/22 at 10:00 with 09:30 arrival. Patient is to report to Radiology.   Patient plans to come to the [facility] office on Monday at 3 pm for labs that are ordered.   Patient with no questions or concerns. She reports the pain to her abdomen has worsened since [place] and she wants to get some answers.

## 2019-12-18 NOTE — Progress Notes (Signed)
Orders for MRI and labs placed

## 2019-12-21 ENCOUNTER — Other Ambulatory Visit: Payer: Medicaid Other

## 2019-12-21 ENCOUNTER — Other Ambulatory Visit: Payer: Self-pay

## 2019-12-21 DIAGNOSIS — N83209 Unspecified ovarian cyst, unspecified side: Secondary | ICD-10-CM

## 2019-12-22 LAB — CA 125: Cancer Antigen (CA) 125: 20 U/mL (ref 0.0–38.1)

## 2019-12-22 LAB — LACTATE DEHYDROGENASE: LDH: 163 IU/L (ref 119–226)

## 2019-12-25 ENCOUNTER — Other Ambulatory Visit: Payer: Self-pay | Admitting: Obstetrics and Gynecology

## 2019-12-25 DIAGNOSIS — N83209 Unspecified ovarian cyst, unspecified side: Secondary | ICD-10-CM

## 2019-12-25 NOTE — Progress Notes (Signed)
MRI order placed.

## 2019-12-26 ENCOUNTER — Ambulatory Visit (HOSPITAL_COMMUNITY): Admission: RE | Admit: 2019-12-26 | Payer: Medicaid Other | Source: Ambulatory Visit

## 2020-01-01 ENCOUNTER — Telehealth (HOSPITAL_COMMUNITY): Payer: Self-pay | Admitting: Obstetrics and Gynecology

## 2020-01-01 NOTE — Telephone Encounter (Signed)
01/01/20~Patient Sheri Woodward is FULL. LM on Mother/[name] Sheri Woodward for patient. **Please note that if patient c/b, she must s/w ref MD b4 rschd** MF

## 2020-01-06 ENCOUNTER — Ambulatory Visit (HOSPITAL_COMMUNITY): Payer: Medicaid Other

## 2020-01-13 ENCOUNTER — Ambulatory Visit: Payer: Self-pay | Admitting: Obstetrics and Gynecology

## 2020-01-13 ENCOUNTER — Other Ambulatory Visit: Payer: Self-pay

## 2020-01-14 ENCOUNTER — Ambulatory Visit (HOSPITAL_COMMUNITY)
Admission: RE | Admit: 2020-01-14 | Discharge: 2020-01-14 | Disposition: A | Discharge status: Home / Self Care | Payer: Medicaid Other | Source: Ambulatory Visit | Attending: Obstetrics and Gynecology | Admitting: Obstetrics and Gynecology

## 2020-01-14 ENCOUNTER — Ambulatory Visit: Payer: Medicaid Other | Admitting: *Deleted

## 2020-01-14 ENCOUNTER — Other Ambulatory Visit: Payer: Self-pay

## 2020-01-14 ENCOUNTER — Ambulatory Visit: Payer: Medicaid Other

## 2020-01-14 VITALS — BP 102/72 | Temp 97.3°F | Wt 125.8 lb

## 2020-01-14 DIAGNOSIS — R1907 Generalized intra-abdominal and pelvic swelling, mass and lump: Secondary | ICD-10-CM | POA: Insufficient documentation

## 2020-01-14 DIAGNOSIS — R87610 Atypical squamous cells of undetermined significance on cytologic smear of cervix (ASC-US): Secondary | ICD-10-CM

## 2020-01-14 DIAGNOSIS — N83209 Unspecified ovarian cyst, unspecified side: Secondary | ICD-10-CM | POA: Insufficient documentation

## 2020-01-14 DIAGNOSIS — Z1239 Encounter for other screening for malignant neoplasm of breast: Secondary | ICD-10-CM

## 2020-01-14 MED ORDER — GADOBUTROL 1 MMOL/ML IV SOLN
6.0000 mL | Freq: Once | INTRAVENOUS | Status: AC | PRN
  Administered 2020-01-14: 6 mL via INTRAVENOUS

## 2020-01-14 NOTE — Patient Instructions (Addendum)
Explained breast self awareness with Sheri Woodward. Patient did not need a Pap smear today due to last Pap smear was 12/01/2019. Explained the colposcopy the recommended follow-up for her abnormal Pap smear. Referred patient to the [facility] for [facility] for a colposcopy. Appointment scheduled Tuesday, January 26, 2020 at 1035. Patient aware of appointment and will be there. Discussed smoking cessation with patient. Referred to the [place] quit line and gave resources to the free smoking cessation classes at [facility]. Let patient know a screening mammogram is recommended at age 49 unless clinically indicated prior. Sheri Woodward verbalized understanding.  Sheri Woodward, Kathaleen Maser, RN 1:32 PM

## 2020-01-14 NOTE — Progress Notes (Signed)
Ms. Sheri Woodward is a 29 y.o. female who presents to [facility] clinic today with no complaints. Patient was referred to [facility] by the [facility] due to having an abnormal Pap smear 12/01/2019 that a colposcopy is recommended for follow-up.   Pap Smear: Pap not smear completed today. Last Pap smear was 12/01/2019 at the [facility] for [facility] clinic and was ASCUS with positive HPV. Per patient has a history of an abnormal Pap smear around 2015 or 2016 that no follow-up was completed. Last Pap smear result is in Epic.   Physical exam: Breasts Breasts symmetrical. No skin abnormalities bilateral breasts. No nipple retraction bilateral breasts. No nipple discharge bilateral breasts. No lymphadenopathy. No lumps palpated bilateral breasts. No complaints of pain or tenderness on exam. Screening mammogram recommended at age 32 unless clinically indicated prior.       Pelvic/Bimanual Pap is not indicated today per BCCCP guidelines.    Smoking History: Patient is a current smoker. Discussed smoking cessation with patient. Referred to the [place] quit line and gave resources to the free smoking cessation classes at [facility].   Patient Navigation: Patient education provided. Access to services provided for patient through BCCCP program.    Breast and Cervical Cancer Risk Assessment: Patient has a family history of her paternal grandmother having breast cancer, known genetic mutations, or radiation treatment to the chest before age 34. Patient has no history of cervical dysplasia, immunocompromised, or DES exposure in-utero. Breast cancer risk assessment completed. No breast cancer risk calculated due to patient is less than 66 years old.  A: BCCCP exam without pap smear  P: Referred patient to the [facility] for [facility] for a colposcopy. Appointment scheduled Tuesday, January 26, 2020 at 1035.  Priscille Heidelberg, RN 01/14/2020 1:32 PM

## 2020-01-26 ENCOUNTER — Ambulatory Visit (INDEPENDENT_AMBULATORY_CARE_PROVIDER_SITE_OTHER): Payer: Medicaid Other | Admitting: Obstetrics and Gynecology

## 2020-01-26 ENCOUNTER — Other Ambulatory Visit: Payer: Self-pay

## 2020-01-26 ENCOUNTER — Encounter: Payer: Self-pay | Admitting: Obstetrics and Gynecology

## 2020-01-26 ENCOUNTER — Other Ambulatory Visit (HOSPITAL_COMMUNITY)
Admission: RE | Admit: 2020-01-26 | Discharge: 2020-01-26 | Disposition: A | Discharge status: Home / Self Care | Payer: Medicaid Other | Source: Ambulatory Visit | Attending: Obstetrics and Gynecology | Admitting: Obstetrics and Gynecology

## 2020-01-26 VITALS — BP 105/72 | HR 85 | Ht 66.0 in | Wt 125.6 lb

## 2020-01-26 DIAGNOSIS — Z3202 Encounter for pregnancy test, result negative: Secondary | ICD-10-CM | POA: Diagnosis not present

## 2020-01-26 DIAGNOSIS — R87610 Atypical squamous cells of undetermined significance on cytologic smear of cervix (ASC-US): Secondary | ICD-10-CM

## 2020-01-26 DIAGNOSIS — R87810 Cervical high risk human papillomavirus (HPV) DNA test positive: Secondary | ICD-10-CM | POA: Insufficient documentation

## 2020-01-26 DIAGNOSIS — Z658 Other specified problems related to psychosocial circumstances: Secondary | ICD-10-CM

## 2020-01-26 LAB — POCT PREGNANCY, URINE: Preg Test, Ur: NEGATIVE

## 2020-01-26 NOTE — Progress Notes (Signed)
    GYNECOLOGY CLINIC COLPOSCOPY PROCEDURE NOTE  29 y.o. 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 Discussed role for HPV in cervical dysplasia, need for surveillance.  Patient given informed consent, signed copy in the chart, time out was performed.  Placed in lithotomy position. Cervix viewed with speculum and colposcope after application of acetic acid.   Colposcopy adequate? Yes  acetowhite lesion(s) noted at 6 & 12 o'clock; corresponding biopsies obtained.  ECC specimen obtained. Monsel's applied All specimens were labelled and sent to pathology.   Patient was given post procedure instructions.  Will follow up pathology and manage accordingly.  Routine preventative health maintenance measures emphasized.    Nettie Elm, MD, FACOG Attending Obstetrician & Gynecologist Center for [facility], [facility]

## 2020-01-26 NOTE — Patient Instructions (Signed)
Colposcopy, Care After This sheet gives you information about how to care for yourself after your procedure. Your doctor may also give you more specific instructions. If you have problems or questions, contact your doctor. What can I expect after the procedure? If you did not have a tissue sample removed (did not have a biopsy), you may only have some spotting for a few days. You can go back to your normal activities. If you had a tissue sample removed, it is common to have:  Soreness and pain. This may last for a few days.  Light-headedness.  Mild bleeding from your vagina or dark-colored, grainy discharge from your vagina. This may last for a few days. You may need to wear a sanitary pad.  Spotting for at least 48 hours after the procedure. Follow these instructions at home:   Take over-the-counter and prescription medicines only as told by your doctor. Ask your doctor what medicines you can start taking again. This is very important if you take blood-thinning medicine.  Do not drive or use heavy machinery while taking prescription pain medicine.  For 3 days, or as long as your doctor tells you, avoid: ? Douching. ? Using tampons. ? Having sex.  If you use birth control (contraception), keep using it.  Limit activity for the first day after the procedure. Ask your doctor what activities are safe for you.  It is up to you to get the results of your procedure. Ask your doctor when your results will be ready.  Keep all follow-up visits as told by your doctor. This is important. Contact a doctor if:  You get a skin rash. Get help right away if:  You are bleeding a lot from your vagina. It is a lot of bleeding if you are using more than one pad an hour for 2 hours in a row.  You have clumps of blood (blood clots) coming from your vagina.  You have a fever.  You have chills  You have pain in your lower belly (pelvic area).  You have signs of infection, such as vaginal  discharge that is: ? Different than usual. ? Yellow. ? Bad-smelling.  You have very pain or cramps in your lower belly that do not get better with medicine.  You feel light-headed.  You feel dizzy.  You pass out (faint). Summary  If you did not have a tissue sample removed (did not have a biopsy), you may only have some spotting for a few days. You can go back to your normal activities.  If you had a tissue sample removed, it is common to have mild pain and spotting for 48 hours.  For 3 days, or as long as your doctor tells you, avoid douching, using tampons and having sex.  Get help right away if you have bleeding, very bad pain, or signs of infection. This information is not intended to replace advice given to you by your health care provider. Make sure you discuss any questions you have with your health care provider. Document Revised: 07/05/2017 Document Reviewed: 04/11/2016 Elsevier Patient Education  2020 Elsevier Inc.  

## 2020-01-26 NOTE — Addendum Note (Signed)
Addended by: Maxwell Marion E on: 01/26/2020 12:09 PM   Modules accepted: Orders

## 2020-01-27 NOTE — BH Specialist Note (Deleted)
Pt requests to reschedule for virtual visit with [facility] on Tuesday, July 13@10 :15am.

## 2020-01-28 LAB — SURGICAL PATHOLOGY

## 2020-02-02 NOTE — BH Specialist Note (Signed)
Pt did not arrive to video visit and did not answer the phone ;Left HIPPA-compliant message to call back Asher Muir from Center for [employer] at [facility] at [phone] (main office) or [phone] (Cahterine Heinzel's office).  ; left MyChart message for patient.    Integrated Behavioral Health via Telemedicine Video [facility]) Visit  02/02/2020 HARNOOR RETA [phone]  Rae Lips

## 2020-02-10 ENCOUNTER — Emergency Department: Admit: 2020-02-10 | Payer: MEDICAID

## 2020-02-10 ENCOUNTER — Inpatient Hospital Stay: Admit: 2020-02-10 | Discharge: 2020-02-10 | Disposition: A | Discharge status: Home / Self Care | Payer: MEDICAID

## 2020-02-10 DIAGNOSIS — R10813 Right lower quadrant abdominal tenderness: Secondary | ICD-10-CM

## 2020-02-10 LAB — CBC WITH AUTO DIFFERENTIAL
Basophils %: 1 % (ref 0–1)
Basophils Absolute: 0.1 10*3/uL (ref 0.0–0.1)
Eosinophils %: 1 % (ref 0–7)
Eosinophils Absolute: 0.1 10*3/uL (ref 0.0–0.4)
Granulocyte Absolute Count: 0 10*3/uL (ref 0.00–0.04)
Hematocrit: 41 % (ref 35.0–47.0)
Hemoglobin: 13.8 g/dL (ref 11.5–16.0)
Immature Granulocytes: 0 % (ref 0–0.5)
Lymphocytes %: 14 % (ref 12–49)
Lymphocytes Absolute: 1.7 10*3/uL (ref 0.8–3.5)
MCH: 31.6 PG (ref 26.0–34.0)
MCHC: 33.7 g/dL (ref 30.0–36.5)
MCV: 93.8 FL (ref 80.0–99.0)
MPV: 10.4 FL (ref 8.9–12.9)
Monocytes %: 6 % (ref 5–13)
Monocytes Absolute: 0.7 10*3/uL (ref 0.0–1.0)
NRBC Absolute: 0 10*3/uL (ref 0.00–0.01)
Neutrophils %: 78 % — ABNORMAL HIGH (ref 32–75)
Neutrophils Absolute: 9.8 10*3/uL — ABNORMAL HIGH (ref 1.8–8.0)
Nucleated RBCs: 0 PER 100 WBC
Platelets: 216 10*3/uL (ref 150–400)
RBC: 4.37 M/uL (ref 3.80–5.20)
RDW: 12.6 % (ref 11.5–14.5)
WBC: 12.4 10*3/uL — ABNORMAL HIGH (ref 3.6–11.0)

## 2020-02-10 LAB — URINALYSIS W/ REFLEX CULTURE
BACTERIA, URINE: NEGATIVE /hpf
Bacteria: NEGATIVE /hpf
Bilirubin, Urine: NEGATIVE
Bilirubin: NEGATIVE
Blood, Urine: 50 — AB
Blood: 50 — AB
Glucose, Ur: NORMAL mg/dL — AB
Glucose: NORMAL mg/dL — AB
Ketone: NEGATIVE mg/dL
Ketones, Urine: NEGATIVE mg/dL
Leukocyte Esterase, Urine: 75 — AB
Leukocyte Esterase: 75 — AB
Nitrite, Urine: NEGATIVE
Nitrites: NEGATIVE
Protein, UA: NEGATIVE mg/dL
Protein: NEGATIVE mg/dL
Specific Gravity, UA: 1.02 (ref 1.003–1.030)
Specific gravity: 1.02 (ref 1.003–1.030)
Urobilinogen, UA, POCT: NORMAL EU/dL (ref 0.1–1.0)
Urobilinogen: NORMAL EU/dL (ref 0.1–1.0)
pH (UA): 5 (ref 5.0–8.0)
pH, UA: 5 (ref 5.0–8.0)

## 2020-02-10 LAB — COMPREHENSIVE METABOLIC PANEL
ALT: 11 U/L — ABNORMAL LOW (ref 12–78)
AST: 12 U/L — ABNORMAL LOW (ref 15–37)
Albumin/Globulin Ratio: 0.9 — ABNORMAL LOW (ref 1.1–2.2)
Albumin: 3.5 g/dL (ref 3.5–5.0)
Alkaline Phosphatase: 54 U/L (ref 45–117)
Anion Gap: 4 mmol/L — ABNORMAL LOW (ref 5–15)
BUN: 17 mg/dL (ref 6–20)
Bun/Cre Ratio: 16 (ref 12–20)
CO2: 28 mmol/L (ref 21–32)
Calcium: 9 mg/dL (ref 8.5–10.1)
Chloride: 105 mmol/L (ref 97–108)
Creatinine: 1.06 mg/dL — ABNORMAL HIGH (ref 0.55–1.02)
EGFR IF NonAfrican American: >60 mL/min/{1.73_m2} (ref >60)
GFR African American: >60 mL/min/{1.73_m2} (ref >60)
Globulin: 4 g/dL (ref 2.0–4.0)
Glucose: 106 mg/dL — ABNORMAL HIGH (ref 65–100)
Potassium: 3.8 mmol/L (ref 3.5–5.1)
Sodium: 137 mmol/L (ref 136–145)
Total Bilirubin: 0.4 mg/dL (ref 0.2–1.0)
Total Protein: 7.5 g/dL (ref 6.4–8.2)

## 2020-02-10 LAB — KOH, OTHER SOURCES
KOH: NONE SEEN
KOH: NONE SEEN

## 2020-02-10 LAB — BETA HCG, QT
Beta HCG, QT: <1 m[IU]/mL (ref 0–6)
hCG Quant: <1 m[IU]/mL (ref 0–6)

## 2020-02-10 LAB — CBC WITH AUTOMATED DIFF
ABS. BASOPHILS: 0.1 10*3/uL (ref 0.0–0.1)
ABS. EOSINOPHILS: 0.1 10*3/uL (ref 0.0–0.4)
ABS. IMM. GRANS.: 0 10*3/uL (ref 0.00–0.04)
ABS. LYMPHOCYTES: 1.7 10*3/uL (ref 0.8–3.5)
ABS. MONOCYTES: 0.7 10*3/uL (ref 0.0–1.0)
ABS. NEUTROPHILS: 9.8 10*3/uL — ABNORMAL HIGH (ref 1.8–8.0)
ABSOLUTE NRBC: 0 10*3/uL (ref 0.00–0.01)
BASOPHILS: 1 % (ref 0–1)
EOSINOPHILS: 1 % (ref 0–7)
HCT: 41 % (ref 35.0–47.0)
HGB: 13.8 g/dL (ref 11.5–16.0)
IMMATURE GRANULOCYTES: 0 % (ref 0–0.5)
LYMPHOCYTES: 14 % (ref 12–49)
MCH: 31.6 PG (ref 26.0–34.0)
MCHC: 33.7 g/dL (ref 30.0–36.5)
MCV: 93.8 FL (ref 80.0–99.0)
MONOCYTES: 6 % (ref 5–13)
MPV: 10.4 FL (ref 8.9–12.9)
NEUTROPHILS: 78 % — ABNORMAL HIGH (ref 32–75)
NRBC: 0 PER 100 WBC
PLATELET: 216 10*3/uL (ref 150–400)
RBC: 4.37 M/uL (ref 3.80–5.20)
RDW: 12.6 % (ref 11.5–14.5)
WBC: 12.4 10*3/uL — ABNORMAL HIGH (ref 3.6–11.0)

## 2020-02-10 LAB — METABOLIC PANEL, COMPREHENSIVE
A-G Ratio: 0.9 — ABNORMAL LOW (ref 1.1–2.2)
ALT (SGPT): 11 U/L — ABNORMAL LOW (ref 12–78)
AST (SGOT): 12 U/L — ABNORMAL LOW (ref 15–37)
Albumin: 3.5 g/dL (ref 3.5–5.0)
Alk. phosphatase: 54 U/L (ref 45–117)
Anion gap: 4 mmol/L — ABNORMAL LOW (ref 5–15)
BUN/Creatinine ratio: 16 (ref 12–20)
BUN: 17 mg/dL (ref 6–20)
Bilirubin, total: 0.4 mg/dL (ref 0.2–1.0)
CO2: 28 mmol/L (ref 21–32)
Calcium: 9 mg/dL (ref 8.5–10.1)
Chloride: 105 mmol/L (ref 97–108)
Creatinine: 1.06 mg/dL — ABNORMAL HIGH (ref 0.55–1.02)
GFR est AA: >60 mL/min/{1.73_m2} (ref >60)
GFR est non-AA: >60 mL/min/{1.73_m2} (ref >60)
Globulin: 4 g/dL (ref 2.0–4.0)
Glucose: 106 mg/dL — ABNORMAL HIGH (ref 65–100)
Potassium: 3.8 mmol/L (ref 3.5–5.1)
Protein, total: 7.5 g/dL (ref 6.4–8.2)
Sodium: 137 mmol/L (ref 136–145)

## 2020-02-10 MED ORDER — HYDROCODONE-ACETAMINOPHEN 5 MG-325 MG TAB
5-325 mg | ORAL | Status: AC
Start: 2020-02-10 — End: 2020-02-10
  Administered 2020-02-10: 21:00:00 via ORAL

## 2020-02-10 MED ORDER — DOXYCYCLINE HYCLATE 100 MG TAB
100 mg | ORAL_TABLET | Freq: Two times a day (BID) | ORAL | 0 refills | Status: AC
Start: 2020-02-10 — End: 2020-02-24

## 2020-02-10 MED ORDER — AZITHROMYCIN 500 MG TAB
500 mg | ORAL | Status: AC
Start: 2020-02-10 — End: 2020-02-10
  Administered 2020-02-10: 20:00:00 via ORAL

## 2020-02-10 MED ORDER — WATER FOR INJECTION, STERILE INJECTION
1 gram | INTRAMUSCULAR | Status: AC
Start: 2020-02-10 — End: 2020-02-10
  Administered 2020-02-10: 20:00:00 via INTRAVENOUS

## 2020-02-10 MED ORDER — CEPHALEXIN 500 MG CAP
500 mg | ORAL_CAPSULE | Freq: Four times a day (QID) | ORAL | 0 refills | Status: AC
Start: 2020-02-10 — End: 2020-02-17

## 2020-02-10 MED ORDER — HYDROCODONE-ACETAMINOPHEN 5 MG-325 MG TAB
5-325 mg | ORAL_TABLET | Freq: Four times a day (QID) | ORAL | 0 refills | Status: AC | PRN
Start: 2020-02-10 — End: 2020-02-13

## 2020-02-10 MED FILL — WATER FOR INJECTION, STERILE INJECTION: INTRAMUSCULAR | Qty: 10

## 2020-02-10 MED FILL — HYDROCODONE-ACETAMINOPHEN 5 MG-325 MG TAB: 5-325 mg | ORAL | Qty: 2

## 2020-02-10 MED FILL — AZITHROMYCIN 500 MG TAB: 500 mg | ORAL | Qty: 2

## 2020-02-10 MED FILL — CEFTRIAXONE 1 GRAM SOLUTION FOR INJECTION: 1 gram | INTRAMUSCULAR | Qty: 1
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
## 2020-02-10 NOTE — ED Notes (Signed)
Pt c/o lower abdominal pain for a few months states she's seen multiple MD's for same. Dx with cyst on her L ovary, colonoscopy unremarkable. Diarrhea.

## 2020-02-12 LAB — CULTURE, URINE
Culture result:: NO GROWTH
Culture: NO GROWTH

## 2020-02-14 LAB — CHLAMYDIA / GC-AMPLIFIED
CHLAMYDIA TRACHOMATIS, NAA, 188078: NEGATIVE
Chlamydia trachomatis, NAA: NEGATIVE
NEISSERIA GONORRHOEAE, NAA, 188086: NEGATIVE
Neisseria gonorrhoeae, NAA: NEGATIVE

## 2020-02-16 ENCOUNTER — Ambulatory Visit: Payer: Medicaid Other | Admitting: Clinical

## 2020-02-16 DIAGNOSIS — Z91199 Patient's noncompliance with other medical treatment and regimen due to unspecified reason: Secondary | ICD-10-CM

## 2020-03-10 ENCOUNTER — Encounter: Payer: Self-pay | Admitting: *Deleted

## 2020-09-30 ENCOUNTER — Other Ambulatory Visit: Payer: Self-pay

## 2020-09-30 ENCOUNTER — Emergency Department (HOSPITAL_COMMUNITY)
Admission: EM | Admit: 2020-09-30 | Discharge: 2020-09-30 | Disposition: A | Discharge status: Home / Self Care | Payer: Medicaid Other | Attending: Emergency Medicine | Admitting: Emergency Medicine

## 2020-09-30 ENCOUNTER — Emergency Department (HOSPITAL_COMMUNITY): Payer: Medicaid Other

## 2020-09-30 ENCOUNTER — Encounter (HOSPITAL_COMMUNITY): Payer: Self-pay

## 2020-09-30 DIAGNOSIS — R059 Cough, unspecified: Secondary | ICD-10-CM | POA: Insufficient documentation

## 2020-09-30 DIAGNOSIS — R0602 Shortness of breath: Secondary | ICD-10-CM | POA: Insufficient documentation

## 2020-09-30 DIAGNOSIS — Z8616 Personal history of COVID-19: Secondary | ICD-10-CM | POA: Diagnosis not present

## 2020-09-30 DIAGNOSIS — F17210 Nicotine dependence, cigarettes, uncomplicated: Secondary | ICD-10-CM | POA: Insufficient documentation

## 2020-09-30 DIAGNOSIS — R079 Chest pain, unspecified: Secondary | ICD-10-CM | POA: Insufficient documentation

## 2020-09-30 LAB — CBC
HCT: 42.5 % (ref 36.0–46.0)
Hemoglobin: 14.3 g/dL (ref 12.0–15.0)
MCH: 31 pg (ref 26.0–34.0)
MCHC: 33.6 g/dL (ref 30.0–36.0)
MCV: 92.2 fL (ref 80.0–100.0)
Platelets: 197 10*3/uL (ref 150–400)
RBC: 4.61 MIL/uL (ref 3.87–5.11)
RDW: 12.7 % (ref 11.5–15.5)
WBC: 8 10*3/uL (ref 4.0–10.5)
nRBC: 0 % (ref 0.0–0.2)

## 2020-09-30 LAB — BASIC METABOLIC PANEL
Anion gap: 7 (ref 5–15)
BUN: 17 mg/dL (ref 6–20)
CO2: 27 mmol/L (ref 22–32)
Calcium: 9.4 mg/dL (ref 8.9–10.3)
Chloride: 104 mmol/L (ref 98–111)
Creatinine, Ser: 0.77 mg/dL (ref 0.44–1.00)
GFR, Estimated: >60 mL/min (ref >60)
Glucose, Bld: 101 mg/dL — ABNORMAL HIGH (ref 70–99)
Potassium: 4.3 mmol/L (ref 3.5–5.1)
Sodium: 138 mmol/L (ref 135–145)

## 2020-09-30 LAB — D-DIMER, QUANTITATIVE: D-Dimer, Quant: <0.27 ug/mL-FEU (ref 0.00–0.50)

## 2020-09-30 LAB — I-STAT BETA HCG BLOOD, ED (MC, WL, AP ONLY): I-stat hCG, quantitative: <5 m[IU]/mL (ref <5)

## 2020-09-30 LAB — TROPONIN I (HIGH SENSITIVITY)
Troponin I (High Sensitivity): <2 ng/L (ref <18)
Troponin I (High Sensitivity): <2 ng/L (ref <18)

## 2020-09-30 MED ORDER — KETOROLAC TROMETHAMINE 30 MG/ML IJ SOLN
30.0000 mg | Freq: Once | INTRAMUSCULAR | Status: AC
  Administered 2020-09-30: 30 mg via INTRAVENOUS
  Filled 2020-09-30: qty 1

## 2020-09-30 MED ORDER — METHOCARBAMOL 500 MG PO TABS: 500.0000 mg | ORAL_TABLET | Freq: Two times a day (BID) | ORAL | 0 refills | Status: AC

## 2020-09-30 NOTE — ED Triage Notes (Signed)
Patient reports that she got Covid a month ago and has had intermittent chest pain and SOB since then. Pain worsens when taking a deep breath

## 2020-09-30 NOTE — Discharge Instructions (Signed)
As we discussed, your work-up today was reassuring.  As discussed, this could be musculoskeletal irritation.  You can take Tylenol or Ibuprofen as directed for pain. You can alternate Tylenol and Ibuprofen every 4 hours. If you take Tylenol at 1pm, then you can take Ibuprofen at 5pm. Then you can take Tylenol again at 9pm.   Take Robaxin as prescribed. This medication will make you drowsy so do not drive or drink alcohol when taking it.  Return to the Emergency Department immediately if you experiencing worsening chest pain, difficulty breathing, nausea/vomiting, get very sweaty, headache or any other worsening or concerning symptoms.

## 2020-09-30 NOTE — ED Provider Notes (Signed)
[facility]-EMERGENCY DEPT Provider Note   CSN: [id number] Arrival date & time: 09/30/20  2563     History Chief Complaint  Patient presents with  . Chest Pain  . Shortness of Breath    Sheri Woodward is a 30 y.o. female who presents for evaluation of left-sided chest pain that has been ongoing for about a week or so but worsened last 72 hours.  She reports associated cough, shortness of breath.  She reports that she had Covid about a month ago.  She states that she had some lingering cough, chest pain afterwards.  She also felt like she never got back to her full normal breathing.  She feels like over the last 3 days, the symptoms have gotten worse.  She states that her pain is on the left side just under her left breast.  She states it does not involve the breast itself.  She states it hurts more when she moves and has a sharper pain when she has deep inspiration.  She states the chest pain itself is not worse with exertion.  The pain does not radiate anywhere and does not involve her neck, extremities.  No associated diaphoresis, nausea/vomiting.  She states that when she walks around the house, she gets short of breath very quickly which is abnormal for her.  She states she still had a cough that is productive of phlegm.  No hemoptysis.  She has not been running any fevers.  She does report that she smokes.  Normally she would smoke about 3 times a day but states that that has decreased since COVID.  She denies any cocaine use.  She reports that she had some arrhythmias when she had an overdose.  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  No personal history of MI.  She does not think there has been any family history of MI before the age of 72.  She is currently on oral contraceptive pills.  No history of blood clots in her legs or lungs, recent travel, recent surgeries, history of family clotting disorder.  Patient denies any abdominal pain, nausea/vomiting,  numbness/weakness of arms or legs, leg swelling.   The history is provided by the patient.       History reviewed. No pertinent past medical history.  Patient Active Problem List   Diagnosis Date Noted  . ASCUS with positive high risk HPV cervical 01/26/2020  . Visit for routine gyn exam 12/01/2019  . STD exposure 12/01/2019  . Ovarian cyst 12/01/2019  . Contraceptive management 12/01/2019    History reviewed. No pertinent surgical history.   OB History    Gravida  2   Para      Term      Preterm      AB  1   Living  1     SAB      IAB  1   Ectopic      Multiple      Live Births              History reviewed. No pertinent family history.  Social History   Tobacco Use  . Smoking status: Current Every Day Smoker    Packs/day: 1.00    Years: 15.00    Pack years: 15.00    Types: Cigarettes  . Smokeless tobacco: Never Used  Vaping Use  . Vaping Use: Never used  Substance Use Topics  . Alcohol use: Yes    Alcohol/week: 5.0 standard  drinks    Types: 3 Glasses of wine, 2 Cans of beer per week    Comment: 4-5  . Drug use: Yes    Types: Marijuana    Comment: daily use    Home Medications Prior to Admission medications   Medication Sig Start Date End Date Taking? Authorizing Provider  ferrous sulfate 325 (65 FE) MG EC tablet Take 325 mg by mouth daily with breakfast.   Yes [provider]  LO LOESTRIN FE 1 MG-10 MCG / 10 MCG tablet Take 1 tablet by mouth daily. 12/01/19  Yes Hermina Staggers, MD  methocarbamol (ROBAXIN) 500 MG tablet Take 1 tablet (500 mg total) by mouth 2 (two) times daily. 09/30/20  Yes Maxwell Caul, PA-C  vitamin C (ASCORBIC ACID) 250 MG tablet Take 250 mg by mouth daily.   Yes [provider]    Allergies    Penicillins  Review of Systems   Review of Systems  Constitutional: Negative for fever.  Respiratory: Positive for shortness of breath. Negative for cough.   Cardiovascular: Positive for chest  pain. Negative for leg swelling.  Gastrointestinal: Negative for abdominal pain, nausea and vomiting.  Genitourinary: Negative for dysuria and hematuria.  Neurological: Negative for headaches.  All other systems reviewed and are negative.   Physical Exam Updated Vital Signs BP 96/61 (BP Location: Right Arm)   Pulse 73   Temp 98.3 F (36.8 C) (Oral)   Resp 14   Ht 5\' 7"  (1.702 m)   Wt 61.2 kg   LMP  (LMP Unknown)   SpO2 99%   BMI 21.14 kg/m   Physical Exam Vitals and nursing note reviewed.  Constitutional:      Appearance: Normal appearance. She is well-developed and well-nourished.  HENT:     Head: Normocephalic and atraumatic.     Mouth/Throat:     Mouth: Oropharynx is clear and moist and mucous membranes are normal.  Eyes:     General: Lids are normal.     Extraocular Movements: EOM normal.     Conjunctiva/sclera: Conjunctivae normal.     Pupils: Pupils are equal, round, and reactive to light.  Cardiovascular:     Rate and Rhythm: Normal rate and regular rhythm.     Pulses: Normal pulses.          Radial pulses are 2+ on the right side and 2+ on the left side.       Popliteal pulses are 2+ on the right side and 2+ on the left side.     Heart sounds: Normal heart sounds. No murmur heard. No friction rub. No gallop.   Pulmonary:     Effort: Pulmonary effort is normal.     Breath sounds: Normal breath sounds.     Comments: Lungs clear to auscultation bilaterally.  Symmetric chest rise.  No wheezing, rales, rhonchi. Chest:       Comments: Tenderness noted to the left anterior chest  Abdominal:     Palpations: Abdomen is soft. Abdomen is not rigid.     Tenderness: There is no abdominal tenderness. There is no guarding.     Comments: Abdomen is soft, non-distended, non-tender. No rigidity, No guarding. No peritoneal signs.  Musculoskeletal:        General: Normal range of motion.     Cervical back: Full passive range of motion without pain.     Comments: BLE are  symmetric in appearance without any overlying warmth, erythema, edema.   Skin:  General: Skin is warm and dry.     Capillary Refill: Capillary refill takes less than 2 seconds.  Neurological:     Mental Status: She is alert and oriented to person, place, and time.  Psychiatric:        Mood and Affect: Mood and affect normal.        Speech: Speech normal.     ED Results / Procedures / Treatments   Labs (all labs ordered are listed, but only abnormal results are displayed) Labs Reviewed  BASIC METABOLIC PANEL - Abnormal; Notable for the following components:      Result Value   Glucose, Bld 101 (*)    All other components within normal limits  CBC  D-DIMER, QUANTITATIVE  I-STAT BETA HCG BLOOD, ED ([facility], [facility], [facility] ONLY)  TROPONIN I (HIGH SENSITIVITY)  TROPONIN I (HIGH SENSITIVITY)    EKG EKG Interpretation  Date/Time:  Friday September 30 2020 08:45:03 EST Ventricular Rate:  112 PR Interval:    QRS Duration: 101 QT Interval:  297 QTC Calculation: 406 R Axis:   100 Text Interpretation: Sinus tachycardia Ventricular premature complex Aberrant complex LAE, consider biatrial enlargement Consider RVH w/ secondary repol abnormality Baseline wander in lead(s) II III aVF 12 Lead; Mason-Likar Confirmed by Norman Clay ([id number]) on 09/30/2020 11:33:39 AM   Radiology DG Chest 2 View  Result Date: 09/30/2020 CLINICAL DATA:  30 year old female with intermittent chest pain and shortness of breath following COVID-19. 1 month ago. EXAM: CHEST - 2 VIEW COMPARISON:  None. FINDINGS: Normal lung volumes and mediastinal contours. Visualized tracheal air column is within normal limits. Both lungs appear clear. No pneumothorax or pleural effusion. Negative visible bowel gas and osseous structures. IMPRESSION: Negative.  No acute cardiopulmonary abnormality. Electronically Signed   By: Odessa Fleming M.D.   On: 09/30/2020 09:32    Procedures Procedures   Medications Ordered in ED Medications  ketorolac  (TORADOL) 30 MG/ML injection 30 mg (30 mg Intravenous Given 09/30/20 9485)    ED Course  I have reviewed the triage vital signs and the nursing notes.  Pertinent labs & imaging results that were available during my care of the patient were reviewed by me and considered in my medical decision making (see chart for details).  Clinical Course as of 09/30/20 1450  Fri Sep 30, 2020  4627 I was directly involved in this patients medical care.  [JH]    Clinical Course User Index [JH] China, Eustace Moore, MD   MDM Rules/Calculators/A&P                          30 year old female who presents for evaluation of left-sided chest pain.  She reports she has had some dull pain since being diagnosed with COVID about a month ago but states it worsened last 72 hours.  Reports associated shortness of breath, worse with exertion.  On initial arrival, she is afebrile, nontoxic-appearing but is tachycardic with heart rate into the 115's.  Exam stable.  She does have pleuritic component to her chest pain as well as dyspnea on exertion.  Because of her tachycardia as well as her being on oral contraceptives, cannot PERC her out.  Will obtain D-dimer.  Low suspicion for ACS etiology but is also consideration.  Also consider infectious etiology.  History/physical exam not concerning for dissection.  Trop is negative. I-stat beta is negative. BMP shows normal BUN/Cr. CBC shows no leukocytosis or anemia. Dimer is negative.   CXR  unremarkable.   Delta trop is negative.  Reevaluation.  Tachycardia improved here in the ED.  Patient reports improvement in pain after Toradol here in the ED.  She is hemodynamically stable and well-appearing on exam.  Question of this is musculoskeletal pain given that she states it is worse with movement.  We also discussed the possibility of muscle irritation given her recent coughing.  We will plan to send her home with Robaxin for symptomatic relief.  At this time, given her negative D-dimer  as well as she is not having any hypoxia and her tachycardia has resolved, do not feel that this is a PE.  Additionally, history/physical exam x-ray for ACS. At this time, patient exhibits no emergent life-threatening condition that require further evaluation in ED. Patient had ample opportunity for questions and discussion. All patient's questions were answered with full understanding. Strict return precautions discussed. Patient expresses understanding and agreement to plan.   Portions of this note were generated with Scientist, clinical (histocompatibility and immunogenetics)Dragon dictation software. Dictation errors may occur despite best attempts at proofreading.   Final Clinical Impression(s) / ED Diagnoses Final diagnoses:  Nonspecific chest pain    Rx / DC Orders ED Discharge Orders         Ordered    methocarbamol (ROBAXIN) 500 MG tablet  2 times daily        09/30/20 1132           Rosana HoesLayden, Lindsey A, PA-C 09/30/20 1450    Cheryll CockayneHong, Joshua S, MD 09/30/20 1525

## 2021-02-01 ENCOUNTER — Ambulatory Visit: Payer: Medicaid Other

## 2021-02-10 IMAGING — MR MR PELVIS WO/W CM
9 of 12 series · 32 of 48 positions shown · IV contrast (gadavist)
Comparison: Pelvic ultrasound 12/15/2019

CLINICAL DATA: Vaginal discharge. Lower abdominal pain. Concern for
mass or adenopathy.

EXAM:
MRI PELVIS WITHOUT AND WITH CONTRAST
TECHNIQUE: Multiplanar multisequence MR imaging of the pelvis was performed
both before and after administration of intravenous contrast.
CONTRAST:  6mL GADAVIST GADOBUTROL 1 MMOL/ML IV SOLN

[Series 2: T2 · coronal · 6.0mm · 1.56mm/px · 1 of 25 slices shown (1 of 3)]
[im 1/25]
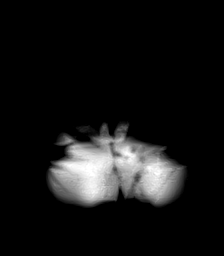

[Series 3: T2 · axial · 5.0mm · 0.43mm/px · z∈[-122,+76]mm · 3 of 34 slices shown (2 of 3)]
[im 1/34]
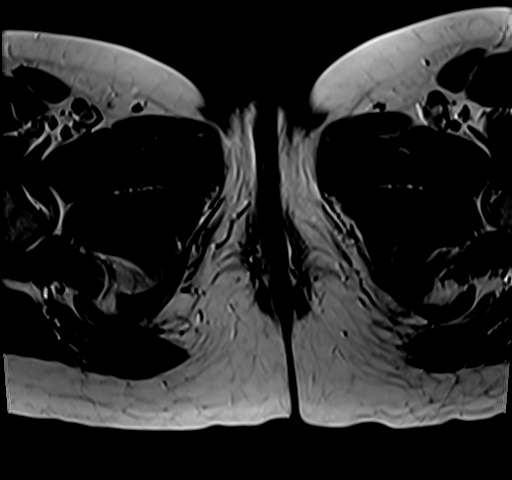
[im 17/34]
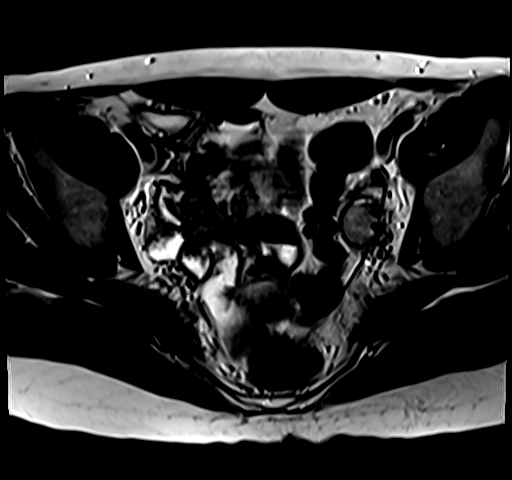
[im 34/34]
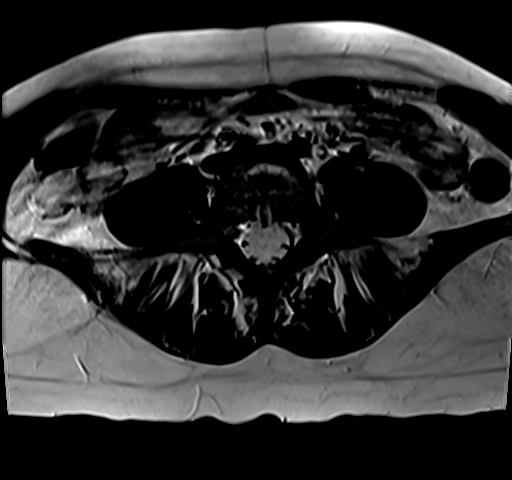

[Series 4: T2 fat-sat · axial · 5.0mm · 0.43mm/px · z∈[-122,+76]mm · 3 of 34 slices shown]
[im 1/34]
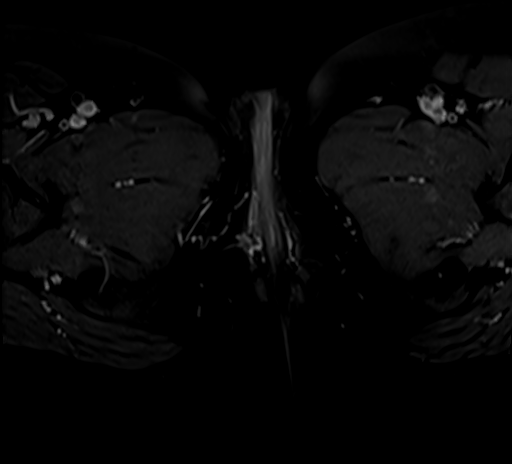
[im 17/34]
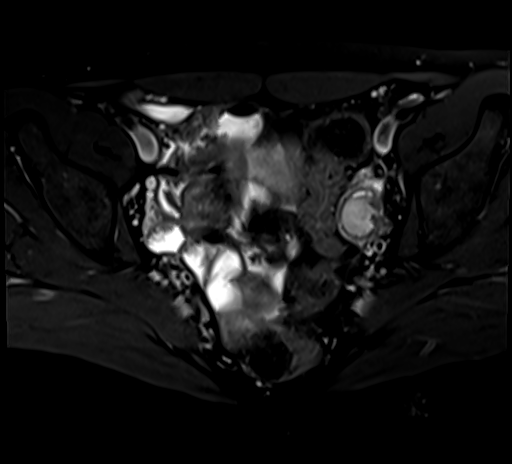
[im 34/34]
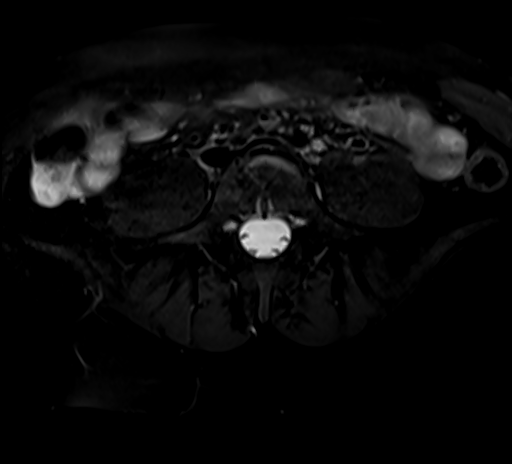

[Series 5: T2 · sagittal · 5.0mm · 0.43mm/px · 3 of 34 slices shown (3 of 3)]
[im 1/34]
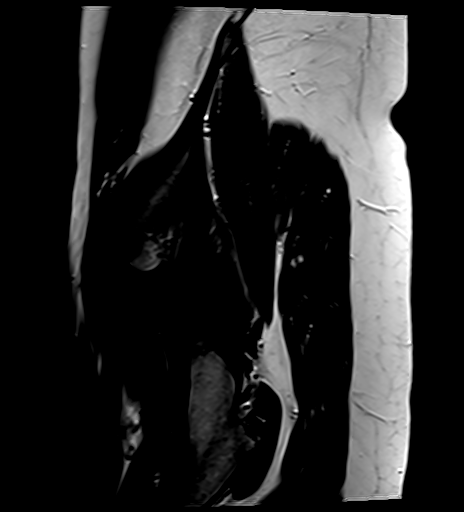
[im 17/34]
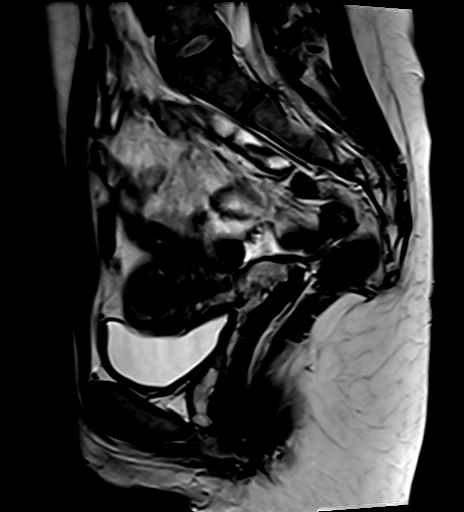
[im 34/34]
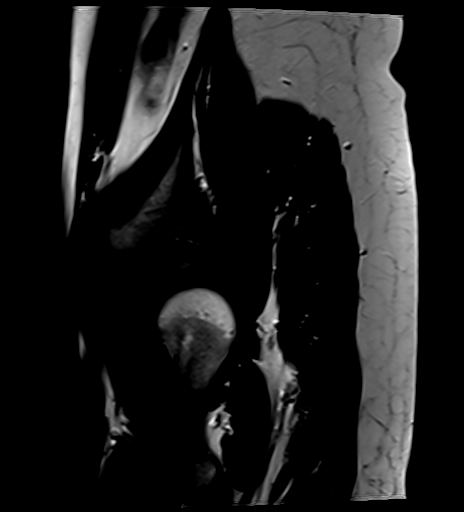

[Series 6: T1 · axial · 5.0mm · 0.43mm/px · z∈[-122,+76]mm · 3 of 34 slices shown]
[im 1/34]
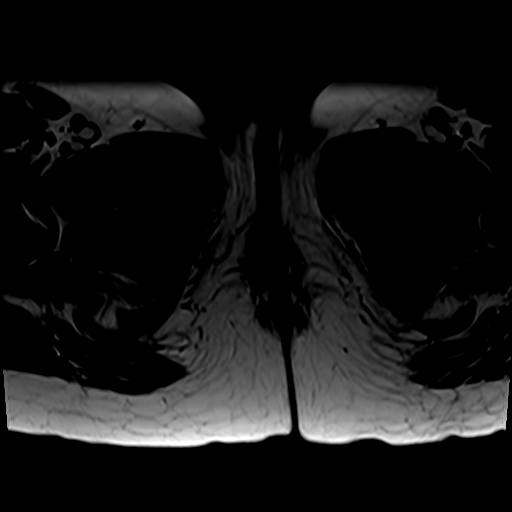
[im 17/34]
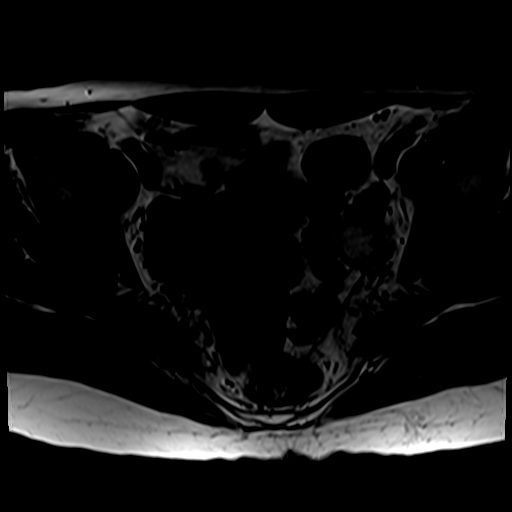
[im 34/34]
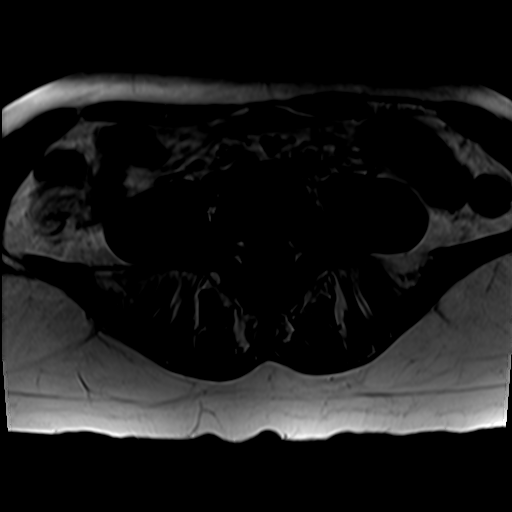

[Series 8: T1 dynamic · axial · 3.0mm · 0.84mm/px · z∈[-133,+104]mm · 6 of 80 slices shown (1 of 3)]
[im 1/80]
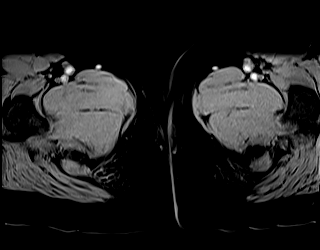
[im 16/80]
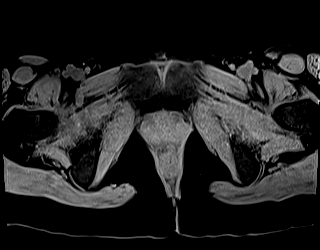
[im 32/80]
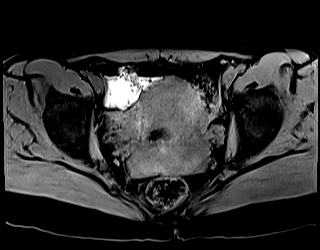
[im 48/80]
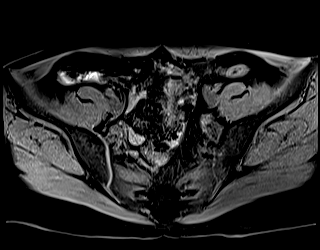
[im 64/80]
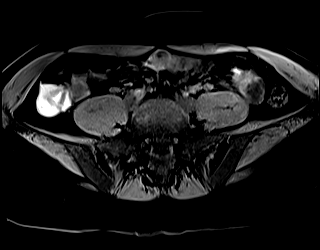
[im 80/80]
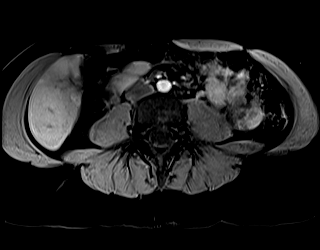

[Series 10: T1 dynamic · axial · 3.0mm · 0.84mm/px · z∈[-133,+104]mm · 6 of 80 slices shown (2 of 3)]
[im 1/80]
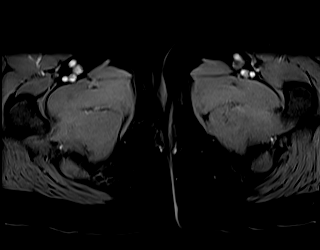
[im 16/80]
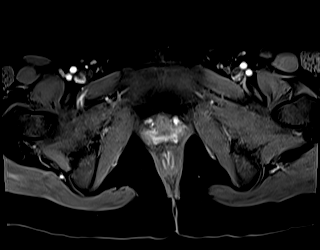
[im 32/80]
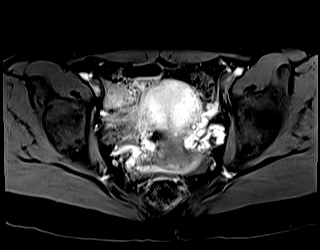
[im 48/80]
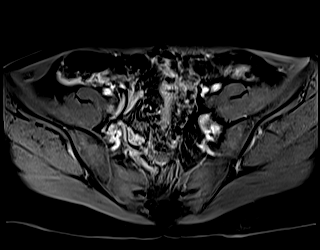
[im 64/80]
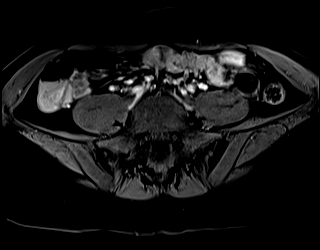
[im 80/80]
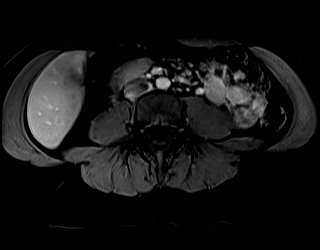

[Series 12: T1 dynamic · axial · 3.0mm · 0.84mm/px · z∈[-133,+104]mm · 6 of 80 slices shown (3 of 3)]
[im 1/80]
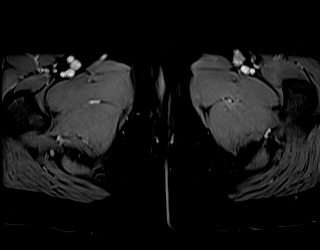
[im 16/80]
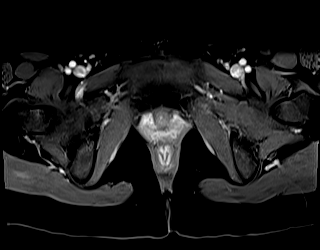
[im 32/80]
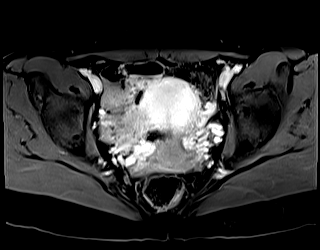
[im 48/80]
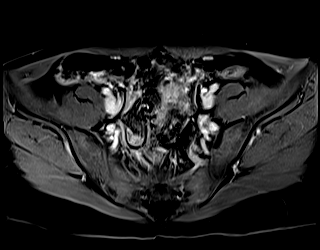
[im 64/80]
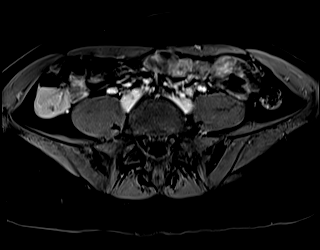
[im 80/80]
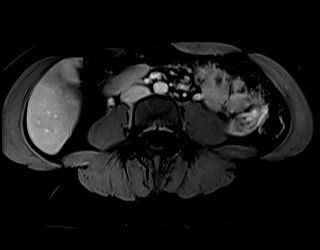

[Series 13: T1 fat-sat post-contrast · coronal · 5.0mm · 0.43mm/px · 1 of 30 slices shown]
[im 1/30]
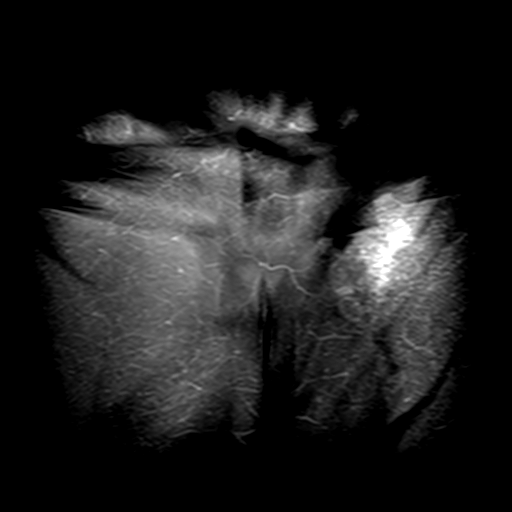

[32 of 48 positions shown; findings below may reference images not displayed]

FINDINGS: Urinary Tract:  Bladder is normal.

Bowel: Limited view of the colon small bowel is unremarkable. The
rectum is normal.

Vascular/Lymphatic: No inguinal adenopathy.  No iliac adenopathy

Reproductive: Uterus is anteverted. Endometrium is thin at 2 mm. The
junctional zone is within normal limits at 8 mm.

Rounded cystic lesion in the LEFT ovary measures 2.1 cm and
hyperintense on T2 weighted imaging (image [DATE]). The LEFT ovary is
normal in size overall at 3.5 x 2.1 cm on axial image [DATE] the same
series.

This 2 cm cystic lesion in the LEFT ovary is high signal intensity
on T1 weighted imaging precontrast indicating internal blood
product. On postcontrast imaging there is no measurable enhancement
by direct measurement or by visual inspection of the subtraction
series (series 100 and 101).

Within the RIGHT ovary small peripheral follicles are noted. Ovaries
normal size at 2.3 by 1.7 cm (image [DATE]).

No free fluid the pelvis.

Other:  No pelvic mass.

Musculoskeletal: No acute or chronic findings of the bony pelvis
IMPRESSION: 1. No mass or adenopathy in the abdomen pelvis.
2. Benign hemorrhagic cyst of the LEFT ovary. RIGHT ovary also
normal. Uterus normal.
3. No acute or chronic osseous findings.

## 2021-03-24 ENCOUNTER — Ambulatory Visit: Attending: Specialist

## 2021-03-24 ENCOUNTER — Ambulatory Visit: Admit: 2021-03-24 | Discharge: 2021-03-24 | Payer: MEDICAID | Attending: Specialist

## 2021-03-24 DIAGNOSIS — Z01419 Encounter for gynecological examination (general) (routine) without abnormal findings: Secondary | ICD-10-CM

## 2021-03-24 MED ORDER — NORETHINDRONE-ETHINYL ESTRADIOL-IRON 1 MG-20 MCG (24)/75 MG (4) TAB
1 mg-20 mcg (24)/75 mg (4) | Freq: Every day | ORAL | 4 refills | Status: DC
Start: 2021-03-24 — End: 2021-05-30
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
## 2021-03-30 LAB — PAP IG, CT-NG-TV, RFX APTIMA HPV ASCUS (199325)
CHLAMYDIA, NUC. ACID AMP, 186134: NEGATIVE
GONOCOCCUS, NUC. ACID AMP, 186135: NEGATIVE
LABCORP 019018: 0
TRICH VAG BY NAA: NEGATIVE

## 2021-03-30 LAB — PAP IG, CT-NG-TV, RFX APTIMA HPV ASCUS (183160,507800)
.: 0
Chlamydia, Nuc. Acid Amp: NEGATIVE
Gonococcus, Nuc. Acid Amp: NEGATIVE
Trich vag by NAA: NEGATIVE

## 2021-04-04 ENCOUNTER — Ambulatory Visit: Attending: Specialist

## 2021-04-04 ENCOUNTER — Ambulatory Visit: Admit: 2021-04-04 | Discharge: 2021-04-04 | Payer: MEDICAID | Attending: Specialist

## 2021-04-04 DIAGNOSIS — R87615 Unsatisfactory cytologic smear of cervix: Secondary | ICD-10-CM

## 2021-04-04 NOTE — Addendum Note (Signed)
Addendum Note  by Larry Sierras at 04/04/21 1100                Author: Larry Sierras  Service: --  Author Type: Medical Assistant       Filed: 04/04/21 1234  Encounter Date: 04/04/2021  Status: Signed          Editor: Larry Sierras (Medical Assistant)          Addended by: Larry Sierras on: 04/04/2021 12:34 PM    Modules accepted: Orders
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
## 2021-04-04 NOTE — Progress Notes (Signed)
Patient saw results on portal, message sent to schedule Colpo appt.

## 2021-04-08 LAB — PAP IG, CT-NG-TV, RFX APTIMA HPV ASCUS (199325)
CHLAMYDIA, NUC. ACID AMP, 186134: NEGATIVE
GONOCOCCUS, NUC. ACID AMP, 186135: NEGATIVE
LABCORP 019018: 0
TRICH VAG BY NAA: NEGATIVE

## 2021-04-08 LAB — PAP IG, CT-NG-TV, RFX APTIMA HPV ASCUS (183160,507800)
.: 0
Chlamydia, Nuc. Acid Amp: NEGATIVE
Gonococcus, Nuc. Acid Amp: NEGATIVE
Trich vag by NAA: NEGATIVE

## 2021-04-19 ENCOUNTER — Ambulatory Visit: Attending: Specialist

## 2021-04-19 ENCOUNTER — Ambulatory Visit: Admit: 2021-04-19 | Discharge: 2021-04-19 | Payer: MEDICAID | Attending: Specialist

## 2021-04-19 DIAGNOSIS — R87612 Low grade squamous intraepithelial lesion on cytologic smear of cervix (LGSIL): Secondary | ICD-10-CM

## 2021-04-19 LAB — AMB POC URINE PREGNANCY TEST, VISUAL COLOR COMPARISON
HCG urine, Ql. (POC): NEGATIVE
HCG, Pregnancy, Urine, POC: NEGATIVE

## 2021-04-19 NOTE — Progress Notes (Signed)
Subjective:  Chief Complaints:        1. Recent abnormal pap smear.  2. Here for Colposcopy.:    HPI:         Shannon Acevedo is a 30 y.o. female who came to today for colposcopy after abnormal pap smear findings.  Patient is doing well today and has no complaints.    ROS:  RESPIRATORY:     Negative for shortness of breath, chest pain, chest congestion, cough.  CARDIOLOGY:    Negative for dizziness, chest pain, palpitations.  FEMALE REPRODUCTIVE:    Negative for abnormal vaginal discharge, abnormal vaginal bleeding.  UROLOGY:    Negative for difficulty urinating, voiding dysfunction, frequent urination, dysuria.    Gyn History:    OB History:  OB History   Gravida Para Term Preterm AB Living   2 1 1   1 1    SAB IAB Ectopic Molar Multiple Live Births     1              # Outcome Date GA Lbr Len/2nd Weight Sex Delivery Anes PTL Lv   2 Term 11/15/08    F Vag-Spont      1 IAB                Current Outpatient Medications   Medication Sig    Blisovi Fe 1/20, 28, 1 mg-20 mcg (21)/75 mg (7) tab     Lo Loestrin Fe 1 mg-10 mcg (24)/10 mcg (2) tab Take 1 Tablet by mouth daily.    ferrous sulfate (IRON) 325 mg (65 mg iron) EC tablet Take 325 mg by mouth.    methocarbamoL (ROBAXIN) 500 mg tablet Take 500 mg by mouth two (2) times a day.    ascorbic acid, vitamin C, (VITAMIN C) 250 mg tablet Take 250 mg by mouth daily.    norethindrone-e estradiol-iron (LOESTRIN FE) 1 mg-20 mcg (24)/75 mg (4) tab Take 1 Tablet by mouth daily. Take 1 pill daily, only active pills, skip placebo    naloxone 2 mg/actuation spry Use 1 spray intranasally, then discard. Repeat with new spray every 2 min as needed for opioid overdose symptoms, alternating nostrils.  Indications: Decrease in Rate & Depth of Breathing due to Opioid Drug     No current facility-administered medications for this visit.        Objective:  Physical Examination:  GENERAL:     General Appearance: well-appearing, well-developed, no acute distress.  Hygiene: unremarkable.      Mental Status:  alert and oriented.  Mood/Affect:  pleasant.  Speech: unremarkable.  HEART:     Rhythm:  regular.  Rate:  normal.  LUNGS:     Auscultation:  CTA bilaterally, no wheezing/rhonchi/rales.  ABDOMEN:       Guarding:  none.  Tenderness:  none.  Masses:  none palpable.  Inguinal nodes:  not enlarged.  Ascites:  none.       Bowel sounds:  normoactive.  Distention:  none.  Rebound tenderness:  none.  RECTUM:     Anus:  no fissures, unremarkable.  GENITOURINARY - FEMALE:     Vagina:  pink/moist mucosa, no gross evidence of lesions, no abnormal discharge.  Cervix/cuff: normal appearance, no lesions/discharge/bleeding:  Colposcopy performed: see Procedure.  External genitalia: normal.    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     Plan:  Pap smear results reviewed with pt.  Colposcopy done today.  Discussed importance of strict followup and  to avoid smoking: Depending on severity, followup may be every 4-6 months or sooner if higher grade and may need immediate treatment.  Goal is to prevent progression to Cervical Cancer.  Discussed immunes system boosters such as adequate sleep; daily multivitamins, diet rich in wholesome nutritional foods and antioxidants and safe sex practices.  Patient expressed understanding; All questions answered.    Procedures:  Colposcopy:  Informed consent Risk, complications, benefits and alternatives discussed with patient, Consent obtained, 30 second time out taken.  Pregnancy status negative. Negative Pregnancy Test.  Colposcopy procedure Acetic Acid 4-6% solution applied to cervix, Endocervical Curreting was performed, Biopsy(ies) taken at 11 o'clock.,Monsels paste applied to biopsy site(S),. Post Colposcopy Hemostasis was assured, Patient tolerated the procedure well, Instructed nothing in vagina for 4-6 days, Stressed importance of strict followup, Discussed importance of NOT SMOKING.  Gardasil discussed in patients under 77 years old, discussed  that Gardasil (HPV Vaccine) can still offer protection against up to 70% of HPV types tht cause wqrts or cervical cancer.    Orders Placed This Encounter    COLPOSC,CERVIX W/ADJ VAG,W/BX & CURRETAG    AMB POC URINE PREGNANCY TEST, VISUAL COLOR COMPARISON    SURGICAL PATHOLOGY     Order Specific Question:   Type of Specimen and Locations?     Answer:   4 oclock     Order Specific Question:   Patient's clinical history:     Answer:   LGSIL    SURGICAL PATHOLOGY     Order Specific Question:   Type of Specimen and Locations?     Answer:   [facility]     Order Specific Question:   Patient's clinical history:     Answer:   lgsil       Preventive:  If < 49 years old, discussed HPV/Cervical CA prevention; Implications of Gardasil Vaccine and May prevent infection of HPV types that cause 70% of warts or cervical dysplasias, however, Yearly PAPs with HPV DNA testing is still necessary.  This can be inquired at [facility].; Discussed need to quit smoking if smoker.  Discussed importance of strict followup PAPs and colposcopies as recommended.    Follow Up:  Follow-up and Dispositions    Return in about 6 months (around 10/17/2021).

## 2021-04-22 LAB — SURGICAL PATHOLOGY

## 2021-04-24 LAB — SURGICAL PATHOLOGY

## 2021-05-22 ENCOUNTER — Encounter

## 2021-05-30 ENCOUNTER — Encounter

## 2021-05-30 MED ORDER — DROSPIRENONE-ETHINYL ESTRADIOL 3 MG-20 MCG (24) TAB
ORAL | 3 refills | Status: AC
Start: 2021-05-30 — End: ?

## 2021-05-30 NOTE — Telephone Encounter (Signed)
Spoke with the patient and advised her per Dr Morene Rankins, a prescription for Shannon Acevedo will be submitted to her pharmacy.  She was advised to finish her current pack and then start the Yaz.

## 2021-05-30 NOTE — Telephone Encounter (Signed)
Patient called stating she has been bleeding heavy for the last 7 days, cramping and experiencing acne.  She would like to discuss her OCPs.  She has been taking Loestrin FE 1/20 since August.  Advised her Dr Morene Rankins is currrently seeing patients but her message would be forwarded to him.

## 2021-10-17 ENCOUNTER — Ambulatory Visit: Payer: MEDICAID | Attending: Specialist

## 2021-11-08 ENCOUNTER — Ambulatory Visit: Attending: Specialist

## 2021-11-08 ENCOUNTER — Encounter: Admit: 2021-11-08 | Discharge: 2021-11-08 | Payer: MEDICAID | Attending: Specialist

## 2021-11-08 DIAGNOSIS — R87612 Low grade squamous intraepithelial lesion on cytologic smear of cervix (LGSIL): Secondary | ICD-10-CM

## 2021-11-08 MED ORDER — NORGESTIMATE-ETHINYL ESTRADIOL 0.25 MG-35 MCG TAB
Freq: Every day | ORAL | 2 refills | Status: AC
Start: 2021-11-08 — End: 2022-01-10

## 2021-11-08 NOTE — Progress Notes (Signed)
Shannon Acevedo is a 31 y.o. female who presents today for the following:  Chief Complaint   Patient presents with   . Abnormal Pap Smear     Pt presents for 6 month repeat pap, LGSIL 04/04/21, with complaints of pelvic pain //MKeeley    . Pelvic Pain        Allergies   Allergen Reactions   . Penicillins Anaphylaxis     Anaphylaxis       Current Outpatient Medications   Medication Sig   . norgestimate-ethinyl estradioL (Sprintec, 28,) 0.25-35 mg-mcg tab Take 1 Tablet by mouth daily for 63 days. Take continuously, only active pills   . drospirenone-ethinyl estradioL (YAZ) 3-0.02 mg tab Take 1 active tablet daily discarding placebos for menstrual suppression   . ferrous sulfate (IRON) 325 mg (65 mg iron) EC tablet Take 325 mg by mouth.   . methocarbamoL (ROBAXIN) 500 mg tablet Take 500 mg by mouth two (2) times a day.   Marland Kitchen ascorbic acid, vitamin C, (VITAMIN C) 250 mg tablet Take 250 mg by mouth daily.   . naloxone 2 mg/actuation spry Use 1 spray intranasally, then discard. Repeat with new spray every 2 min as needed for opioid overdose symptoms, alternating nostrils.  Indications: Decrease in Rate & Depth of Breathing due to Opioid Drug     No current facility-administered medications for this visit.       Past Medical History:   Diagnosis Date   . Abnormal Papanicolaou smear of cervix    . Abuse    . Anemia NEC    . Anxiety    . Arrhythmia    . ASCUS with positive high risk HPV cervical 01/25/2020   . Bipolar 1 disorder, depressed (HCC)    . Depression    . Depression    . LGSIL on Pap smear of cervix    . Ovarian cyst 2021   . Trauma        Past Surgical History:   Procedure Laterality Date   . HX GYN         Family History   Problem Relation Age of Onset   . Asthma Mother    . Alcohol abuse Father    . Psychiatric Disorder Father    . Cancer Paternal Grandmother    . Heart Disease Paternal Grandfather    . Asthma Brother        Social History     Socioeconomic History   . Marital status: SINGLE     Spouse name: Not  on file   . Number of children: Not on file   . Years of education: Not on file   . Highest education level: Not on file   Occupational History   . Not on file   Tobacco Use   . Smoking status: Every Day     Packs/day: 1.00     Years: 10.00     Pack years: 10.00     Types: Cigarettes   . Smokeless tobacco: Never   Substance and Sexual Activity   . Alcohol use: Yes       Alcohol/week: 5.0 standard drinks     Types: 5 Cans of beer per week     Comment: I drink maybe once a month if that   . Drug use: Not Currently       Types: Marijuana, Cocaine, Heroin   . Sexual activity: Yes     Partners: Female, Female     Birth control/protection: Pill  Other Topics Concern   . Not on file   Social History Narrative   . Not on file     Social Determinants of Health     Financial Resource Strain: Not on file   Food Insecurity: Not on file   Transportation Needs: Not on file   Physical Activity: Not on file   Stress: Not on file   Social Connections: Not on file   Intimate Partner Violence: Not on file   Housing Stability: Not on file         ROS   Review of Systems   Constitutional: Negative.    HENT: Negative.    Eyes: Negative.    Respiratory: Negative.    Cardiovascular: Negative.    Gastrointestinal: Negative.    Genitourinary: Negative.    Musculoskeletal: Negative.    Skin: Negative.    Neurological: Negative.    Endo/Heme/Allergies: Negative.    Psychiatric/Behavioral: Negative.      BP 121/71   Ht 5\' 7"  (1.702 m)   Wt 133 lb 6 oz (60.5 kg)   BMI 20.89 kg/m    OBGyn Exam   Constitutional  Appearance: well-nourished, well developed, alert, in no acute distress    HENT  Head and Face: appears normal    Chest  Respiratory Effort: breathing labored    Gastrointestinal  Abdominal Examination: abdomen non-tender to palpation, normal bowel sounds, no masses present    Genitourinary  External Genitalia: normal appearance for age, no discharge present, no tenderness present, no inflammatory lesions present, no masses present, no  atrophy present  Vagina: normal vaginal vault without central or paravaginal defects, no discharge present, no inflammatory lesions present, no masses present  Bladder: non-tender to palpation  Urethra: appears normal  Cervix: normal, TENDER   Uterus: normal size, shape and consistency  Adnexa: LLQ adnexal tenderness present, no adnexal masses present  Perineum: perineum within normal limits, no evidence of trauma, no rashes or skin lesions present  Anus: anus within normal limits, no hemorrhoids present  Inguinal Lymph Nodes: no lymphadenopathy present    Skin  General Inspection: no rash, no lesions identified    Neurologic/Psychiatric  Mental Status:  Orientation: grossly oriented to person, place and time  Mood and Affect: mood normal, affect appropriate    No results found for this visit on 11/08/21.     Orders Placed This Encounter   . norgestimate-ethinyl estradioL (Sprintec, 28,) 0.25-35 mg-mcg tab     Sig: Take 1 Tablet by mouth daily for 63 days. Take continuously, only active pills     Dispense:  3 Dose Pack     Refill:  2     30 YO COMING FOR REPEAT PAP  ALSO LLQ PAIN    1. LGSIL of cervix of undetermined significance      2. LLQ pain    - norgestimate-ethinyl estradioL (Sprintec, 28,) 0.25-35 mg-mcg tab; Take 1 Tablet by mouth daily for 63 days. Take continuously, only active pills  Dispense: 3 Dose Pack; Refill: 2    3. Diarrhea, unspecified type          Follow-up and Dispositions    Return in about 3 months (around 02/07/2022).

## 2021-11-16 LAB — PAP IG, CT-NG-TV, RFX APTIMA HPV ASCUS (199325)
CHLAMYDIA, NUC. ACID AMP, 186134: NEGATIVE
GONOCOCCUS, NUC. ACID AMP, 186135: NEGATIVE
LABCORP 019018: 0
TRICH VAG BY NAA: NEGATIVE

## 2021-11-16 LAB — HPV APTIMA
HPV APTIMA: POSITIVE — AB
HPV Aptima: POSITIVE — AB

## 2021-11-16 LAB — PAP IG, CT-NG-TV, RFX APTIMA HPV ASCUS (183160,507800)
.: 0
Chlamydia, Nuc. Acid Amp: NEGATIVE
Gonococcus, Nuc. Acid Amp: NEGATIVE
Trich vag by NAA: NEGATIVE

## 2021-12-05 ENCOUNTER — Inpatient Hospital Stay: Admit: 2021-12-05 | Payer: MEDICAID | Attending: Specialist

## 2021-12-05 DIAGNOSIS — R1032 Left lower quadrant pain: Secondary | ICD-10-CM

## 2021-12-11 ENCOUNTER — Encounter

## 2021-12-11 MED ORDER — METRONIDAZOLE 500 MG PO TABS
500 MG | ORAL_TABLET | Freq: Two times a day (BID) | ORAL | 0 refills | Status: AC
Start: 2021-12-11 — End: 2021-12-18

## 2021-12-11 NOTE — Telephone Encounter (Signed)
Spoke with the patient and advised her a prescription for Metronidazole will be submitted to her pharmacy.

## 2021-12-11 NOTE — Telephone Encounter (Signed)
Patient is having a procedure with Dr. Cheryll Cockayne on 05/11 and thinks she is having a bacterial infection and would like to know if he can send her a prescription for it

## 2021-12-14 ENCOUNTER — Ambulatory Visit: Admit: 2021-12-14 | Discharge: 2021-12-14 | Payer: MEDICAID | Attending: Specialist

## 2021-12-14 ENCOUNTER — Encounter: Payer: MEDICAID | Attending: Specialist

## 2021-12-14 DIAGNOSIS — R87610 Atypical squamous cells of undetermined significance on cytologic smear of cervix (ASC-US): Secondary | ICD-10-CM

## 2021-12-14 NOTE — Progress Notes (Signed)
Subjective:  Chief Complaints:        1. Recent abnormal pap smear.  2. Here for Colposcopy.:    HPI:         Shannon Acevedo is a 31 y.o. female who came to today for colposcopy after abnormal pap smear findings.  Patient is doing well today and has no complaints.    ROS:  RESPIRATORY:     Negative for shortness of breath, chest pain, chest congestion, cough.  CARDIOLOGY:    Negative for dizziness, chest pain, palpitations.  FEMALE REPRODUCTIVE:    Negative for abnormal vaginal discharge, abnormal vaginal bleeding.  UROLOGY:    Negative for difficulty urinating, voiding dysfunction, frequent urination, dysuria.    Gyn History:    OB History:  OB History   Gravida Para Term Preterm AB Living   2 1 1   1 1    SAB IAB Ectopic Molar Multiple Live Births                    # Outcome Date GA Lbr Len/2nd Weight Sex Delivery Anes PTL Lv   2 Term 11/15/08    F Vag-Spont      1 AB                Current Outpatient Medications   Medication Sig Dispense Refill    norgestimate-ethinyl estradiol (ORTHO-CYCLEN) 0.25-35 MG-MCG per tablet Take 1 tablet by mouth daily 30 tablet 2    metroNIDAZOLE (FLAGYL) 500 MG tablet Take 1 tablet by mouth 2 times daily for 7 days 14 tablet 0     No current facility-administered medications for this visit.       Objective:  Physical Examination:  GENERAL:     General Appearance: well-appearing, well-developed, no acute distress.  Hygiene: unremarkable.     Mental Status:  alert and oriented.  Mood/Affect:  pleasant.  Speech: unremarkable.  HEART:     Rhythm:  regular.  Rate:  normal.  LUNGS:     Auscultation:  CTA bilaterally, no wheezing/rhonchi/rales.  ABDOMEN:       Guarding:  none.  Tenderness:  none.  Masses:  none palpable.  Inguinal nodes:  not enlarged.  Ascites:  none.       Bowel sounds:  normoactive.  Distention:  none.  Rebound tenderness:  none.  RECTUM:     Anus:  no fissures, unremarkable.  GENITOURINARY - FEMALE:     Vagina:  pink/moist mucosa, no gross evidence of lesions, no  abnormal discharge.  Cervix/cuff: normal appearance, no lesions/discharge/bleeding:  Colposcopy performed: see Procedure.  External genitalia: normal.    Assessment:  The encounter diagnosis was ASCUS with positive high risk HPV cervical.     Plan:  Pap smear results reviewed with pt.  Colposcopy done today.  Discussed importance of strict followup and to avoid smoking: Depending on severity, followup may be every 4-6 months or sooner if higher grade and may need immediate treatment.  Goal is to prevent progression to Cervical Cancer.  Discussed immunes system boosters such as adequate sleep; daily multivitamins, diet rich in wholesome nutritional foods and antioxidants and safe sex practices.  Patient expressed understanding; All questions answered.    Procedures:  Colposcopy:  Informed consent Risk, complications, benefits and alternatives discussed with patient, Consent obtained, 30 second time out taken.  Pregnancy status negative. Negative Pregnancy Test.  Colposcopy procedure Acetic Acid 4-6% solution applied to cervix, Endocervical Curreting was performed, Biopsy(ies) taken at 5o'clock.,Monsels paste applied to biopsy  site(S),. Post Colposcopy Hemostasis was assured, Patient tolerated the procedure well, Instructed nothing in vagina for 4-6 days, Stressed importance of strict followup, Discussed importance of NOT SMOKING.  Gardasil discussed in patients under 37 years old, discussed that Gardasil (HPV Vaccine) can still offer protection against up to 70% of HPV types tht cause wqrts or cervical cancer.    Orders Placed This Encounter   Procedures    COLPOSC,CERVIX W/ADJ VAG,W/BX & CURRETAG    Surgical Pathology     5 oclock     Order Specific Question:   PREVIOUS BIOPSY     Answer:   Yes     Order Specific Question:   PREOP DIAGNOSIS     Answer:   ASCUS     Order Specific Question:   FROZEN SECTION - NO OR YES/SPECIMEN     Answer:   No    Surgical Pathology     ECC     Order Specific Question:   PREVIOUS  BIOPSY     Answer:   Yes     Order Specific Question:   PREOP DIAGNOSIS     Answer:   ascus     Order Specific Question:   FROZEN SECTION - NO OR YES/SPECIMEN     Answer:   No       Preventive:  If < 67 years old, discussed HPV/Cervical CA prevention; Implications of Gardasil Vaccine and May prevent infection of HPV types that cause 70% of warts or cervical dysplasias, however, Yearly PAPs with HPV DNA testing is still necessary.  This can be inquired at [facility].; Discussed need to quit smoking if smoker.  Discussed importance of strict followup PAPs and colposcopies as recommended.    Follow Up:  Return in about 6 months (around 06/16/2022).

## 2021-12-19 LAB — SURGICAL PATHOLOGY

## 2021-12-21 ENCOUNTER — Encounter

## 2022-01-04 ENCOUNTER — Ambulatory Visit: Payer: MEDICAID

## 2022-01-04 DIAGNOSIS — R1084 Generalized abdominal pain: Secondary | ICD-10-CM

## 2022-01-18 ENCOUNTER — Encounter

## 2022-01-18 MED ORDER — BUPROPION HCL ER (SR) 150 MG PO TB12
150 MG | ORAL_TABLET | Freq: Two times a day (BID) | ORAL | 3 refills | Status: AC
Start: 2022-01-18 — End: 2022-02-17

## 2022-02-08 ENCOUNTER — Encounter: Payer: MEDICAID | Attending: Specialist | Primary: Family

## 2022-02-12 MED ORDER — NORGESTIMATE-ETH ESTRADIOL 0.25-35 MG-MCG PO TABS
ORAL_TABLET | Freq: Every day | ORAL | 1 refills | Status: DC
Start: 2022-02-12 — End: 2022-02-14

## 2022-02-14 ENCOUNTER — Encounter

## 2022-02-14 MED ORDER — ESTARYLLA 0.25-35 MG-MCG PO TABS
ORAL_TABLET | ORAL | 0 refills | Status: AC
Start: 2022-02-14 — End: ?

## 2022-03-06 ENCOUNTER — Encounter: Payer: MEDICAID | Attending: Specialist | Primary: Family

## 2022-03-23 ENCOUNTER — Ambulatory Visit: Admit: 2022-03-23 | Discharge: 2022-03-23 | Payer: MEDICAID | Attending: Specialist | Primary: Family

## 2022-03-23 DIAGNOSIS — N926 Irregular menstruation, unspecified: Secondary | ICD-10-CM

## 2022-03-23 NOTE — Progress Notes (Signed)
Shannon Acevedo is a 31 y.o. female who presents today for the following:  Chief Complaint   Patient presents with    Follow-up     Pt. Came in a few months ago for irregular menses. She is currently on birth control skipping placebos. She also started taking bupropion to quit smoking        Allergies   Allergen Reactions    Penicillins Anaphylaxis     Anaphylaxis  Anaphylaxis         Current Outpatient Medications   Medication Sig Dispense Refill    norgestimate-ethinyl estradiol (ESTARYLLA) 0.25-35 MG-MCG per tablet TAKE 1 TABLET BY MOUTH DAILY 84 tablet 0    buPROPion (WELLBUTRIN SR) 150 MG extended release tablet Take 1 tablet by mouth 2 times daily 60 tablet 3    ascorbic acid (VITAMIN C) 250 MG tablet Take 1 tablet by mouth daily      drospirenone-ethinyl estradiol (YAZ) 3-0.02 MG per tablet Take 1 active tablet daily discarding placebos for menstrual suppression      ferrous sulfate (FE TABS 325) 325 (65 Fe) MG EC tablet Take 1 tablet by mouth      methocarbamol (ROBAXIN) 500 MG tablet Take 1 tablet by mouth 2 times daily       No current facility-administered medications for this visit.       Past Medical History:   Diagnosis Date    Abnormal Pap smear of cervix     Anemia     Anxiety     Arrhythmia     Bipolar 1 disorder (HCC)     Depression     Drug abuse (HCC)     HPV (human papilloma virus) infection     LGSIL on Pap smear of cervix     Ovarian cyst     Trauma        History reviewed. No pertinent surgical history.    Family History   Problem Relation Age of Onset    Asthma Mother     Alcohol Abuse Father     Psychiatric Disorder Father     Asthma Brother     Cancer Paternal Grandmother     Heart Disease Paternal Grandfather        Social History     Socioeconomic History    Marital status: Single     Spouse name: Not on file    Number of children: Not on file    Years of education: Not on file    Highest education level: Not on file   Occupational History    Not on file   Tobacco Use    Smoking  status: Every Day     Packs/day: 0.50     Years: 10.00     Pack years: 5.00     Types: Cigarettes    Smokeless tobacco: Never   Substance and Sexual Activity    Alcohol use: Yes     Alcohol/week: 6.0 standard drinks     Types: 6 Cans of beer per week    Drug use: Not Currently     Types: Marijuana Sheran Fava)    Sexual activity: Yes     Partners: Female, Female     Birth control/protection: Pill   Other Topics Concern    Not on file   Social History Narrative    Not on file     Social Determinants of Health     Financial Resource Strain: Not on file   Food Insecurity: Not on  file   Transportation Needs: Not on file   Physical Activity: Not on file   Stress: Not on file   Social Connections: Not on file   Intimate Partner Violence: Not on file   Housing Stability: Not on file         Review of Systems     Review of Systems   Constitutional: Negative.    HENT: Negative.    Eyes: Negative.    Respiratory: Negative.    Cardiovascular: Negative.    Gastrointestinal: Negative.    Genitourinary: Negative.    Musculoskeletal: Negative.    Skin: Negative.    Neurological: Negative.    Endo/Heme/Allergies: Negative.    Psychiatric/Behavioral: Negative.      BP 113/65 (Site: Left Upper Arm, Position: Sitting, Cuff Size: Small Adult)   Wt 57.2 kg (126 lb)   BMI 19.73 kg/m    OBGyn Exam   Constitutional  Appearance: well-nourished, well developed, alert, in no acute distress    HENT  Head and Face: appears normal    Chest  Respiratory Effort: breathing labored    Gastrointestinal  Abdominal Examination: abdomen non-tender to palpation, normal bowel sounds, no masses present    Genitourinary  deferred    Skin  General Inspection: no rash, no lesions identified    Neurologic/Psychiatric  Mental Status:  Orientation: grossly oriented to person, place and time  Mood and Affect: mood normal, affect appropriate        No orders of the defined types were placed in this encounter.    31 yo with IRREG VAG BLEED AND SMOKING COMING FOR FOLLOW  UP    1. Irregular menses  - MUCH IMPROVED ON CONT OCPS    2. Depression, unspecified depression type  - DECREASE IN SMOKING ON WELLBUTRIN, NOW DOWN TO 2-3 CIG    CONT CURRENT MANAGEMENT    RTC IN 4 MONTHS    No follow-ups on file.

## 2022-03-23 NOTE — Progress Notes (Deleted)
Shannon Acevedo is a 31 y.o. female who presents today for the following:  Chief Complaint   Patient presents with    Follow-up     Pt. Came in a few months ago for irregular menses. She is currently on birth control skipping placebos. She also started taking bupropion to quit smoking        Allergies   Allergen Reactions    Penicillins Anaphylaxis     Anaphylaxis  Anaphylaxis         Current Outpatient Medications   Medication Sig Dispense Refill    norgestimate-ethinyl estradiol (ESTARYLLA) 0.25-35 MG-MCG per tablet TAKE 1 TABLET BY MOUTH DAILY 84 tablet 0    buPROPion (WELLBUTRIN SR) 150 MG extended release tablet Take 1 tablet by mouth 2 times daily 60 tablet 3    ascorbic acid (VITAMIN C) 250 MG tablet Take 1 tablet by mouth daily      drospirenone-ethinyl estradiol (YAZ) 3-0.02 MG per tablet Take 1 active tablet daily discarding placebos for menstrual suppression      ferrous sulfate (FE TABS 325) 325 (65 Fe) MG EC tablet Take 1 tablet by mouth      methocarbamol (ROBAXIN) 500 MG tablet Take 1 tablet by mouth 2 times daily       No current facility-administered medications for this visit.       Past Medical History:   Diagnosis Date    Abnormal Pap smear of cervix     Anemia     Anxiety     Arrhythmia     Bipolar 1 disorder (HCC)     Depression     Drug abuse (HCC)     HPV (human papilloma virus) infection     LGSIL on Pap smear of cervix     Ovarian cyst     Trauma        History reviewed. No pertinent surgical history.    Family History   Problem Relation Age of Onset    Asthma Mother     Alcohol Abuse Father     Psychiatric Disorder Father     Asthma Brother     Cancer Paternal Grandmother     Heart Disease Paternal Grandfather        Social History     Socioeconomic History    Marital status: Single     Spouse name: Not on file    Number of children: Not on file    Years of education: Not on file    Highest education level: Not on file   Occupational History    Not on file   Tobacco Use    Smoking  status: Every Day     Packs/day: 0.50     Years: 10.00     Pack years: 5.00     Types: Cigarettes    Smokeless tobacco: Never   Substance and Sexual Activity    Alcohol use: Yes     Alcohol/week: 6.0 standard drinks     Types: 6 Cans of beer per week    Drug use: Not Currently     Types: Marijuana Sheran Fava)    Sexual activity: Yes     Partners: Female, Female     Birth control/protection: Pill   Other Topics Concern    Not on file   Social History Narrative    Not on file     Social Determinants of Health     Financial Resource Strain: Not on file   Food Insecurity: Not on  file   Transportation Needs: Not on file   Physical Activity: Not on file   Stress: Not on file   Social Connections: Not on file   Intimate Partner Violence: Not on file   Housing Stability: Not on file         Review of Systems     Review of Systems   Constitutional: Negative.    HENT: Negative.    Eyes: Negative.    Respiratory: Negative.    Cardiovascular: Negative.    Gastrointestinal: Negative.    Genitourinary: Negative.    Musculoskeletal: Negative.    Skin: Negative.    Neurological: Negative.    Endo/Heme/Allergies: Negative.    Psychiatric/Behavioral: Negative.      BP 113/65 (Site: Left Upper Arm, Position: Sitting, Cuff Size: Small Adult)   Wt 57.2 kg (126 lb)   BMI 19.73 kg/m    OBGyn Exam   Constitutional  Appearance: well-nourished, well developed, alert, in no acute distress    HENT  Head and Face: appears normal    Chest  Respiratory Effort: breathing labored    Gastrointestinal  Abdominal Examination: abdomen non-tender to palpation, normal bowel sounds, no masses present    Genitourinary  External Genitalia: normal appearance for age, no discharge present, no tenderness present, no inflammatory lesions present, no masses present, no atrophy present  Vagina: normal vaginal vault without central or paravaginal defects, no discharge present, no inflammatory lesions present, no masses present  Bladder: non-tender to  palpation  Urethra: appears normal  Cervix: normal   Uterus: normal size, shape and consistency  Adnexa: no adnexal tenderness present, no adnexal masses present  Perineum: perineum within normal limits, no evidence of trauma, no rashes or skin lesions present  Anus: anus within normal limits, no hemorrhoids present  Inguinal Lymph Nodes: no lymphadenopathy present    Skin  General Inspection: no rash, no lesions identified    Neurologic/Psychiatric  Mental Status:  Orientation: grossly oriented to person, place and time  Mood and Affect: mood normal, affect appropriate        No orders of the defined types were placed in this encounter.        There are no diagnoses linked to this encounter.      No follow-ups on file.

## 2022-04-05 ENCOUNTER — Inpatient Hospital Stay: Admit: 2022-04-05 | Discharge: 2022-04-05 | Disposition: A | Discharge status: Home / Self Care | Payer: MEDICAID

## 2022-04-05 ENCOUNTER — Emergency Department: Admit: 2022-04-05 | Payer: MEDICAID | Primary: Family

## 2022-04-05 DIAGNOSIS — R1011 Right upper quadrant pain: Secondary | ICD-10-CM

## 2022-04-05 LAB — CBC WITH AUTO DIFFERENTIAL
Absolute Immature Granulocyte: 0 10*3/uL (ref 0.00–0.04)
Basophils %: 1 % (ref 0–1)
Basophils Absolute: 0 10*3/uL (ref 0.0–0.1)
Eosinophils %: 1 % (ref 0–7)
Eosinophils Absolute: 0.1 10*3/uL (ref 0.0–0.4)
Hematocrit: 37.9 % (ref 35.0–47.0)
Hemoglobin: 12.8 g/dL (ref 11.5–16.0)
Immature Granulocytes: 0 % (ref 0–0.5)
Lymphocytes %: 20 % (ref 12–49)
Lymphocytes Absolute: 1.6 10*3/uL (ref 0.8–3.5)
MCH: 31.8 PG (ref 26.0–34.0)
MCHC: 33.8 g/dL (ref 30.0–36.5)
MCV: 94 FL (ref 80.0–99.0)
MPV: 9.9 FL (ref 8.9–12.9)
Monocytes %: 5 % (ref 5–13)
Monocytes Absolute: 0.4 10*3/uL (ref 0.0–1.0)
Neutrophils %: 73 % (ref 32–75)
Neutrophils Absolute: 5.9 10*3/uL (ref 1.8–8.0)
Nucleated RBCs: 0 PER 100 WBC
Platelets: 218 10*3/uL (ref 150–400)
RBC: 4.03 M/uL (ref 3.80–5.20)
RDW: 13.3 % (ref 11.5–14.5)
WBC: 8 10*3/uL (ref 3.6–11.0)
nRBC: 0 10*3/uL (ref 0.00–0.01)

## 2022-04-05 LAB — URINALYSIS WITH REFLEX TO CULTURE
Bilirubin Urine: NEGATIVE
Blood, Urine: NEGATIVE
Glucose, UA: NEGATIVE mg/dL
Ketones, Urine: NEGATIVE mg/dL
Leukocyte Esterase, Urine: NEGATIVE
Nitrite, Urine: NEGATIVE
Protein, UA: NEGATIVE mg/dL
Specific Gravity, UA: 1.014 (ref 1.003–1.030)
Urobilinogen, Urine: 0.1 EU/dL (ref 0.1–1.0)
pH, Urine: 7 (ref 5.0–8.0)

## 2022-04-05 LAB — COMPREHENSIVE METABOLIC PANEL
ALT: 13 U/L (ref 12–78)
AST: 11 U/L — ABNORMAL LOW (ref 15–37)
Albumin/Globulin Ratio: 1 — ABNORMAL LOW (ref 1.1–2.2)
Albumin: 3.7 g/dL (ref 3.5–5.0)
Alk Phosphatase: 43 U/L — ABNORMAL LOW (ref 45–117)
Anion Gap: 7 mmol/L (ref 5–15)
BUN: 6 mg/dL (ref 6–20)
Bun/Cre Ratio: 6 — ABNORMAL LOW (ref 12–20)
CO2: 28 mmol/L (ref 21–32)
Calcium: 9.2 mg/dL (ref 8.5–10.1)
Chloride: 105 mmol/L (ref 97–108)
Creatinine: 0.99 mg/dL (ref 0.55–1.02)
Est, Glom Filt Rate: >60 mL/min/{1.73_m2} (ref >60)
Globulin: 3.7 g/dL (ref 2.0–4.0)
Glucose: 91 mg/dL (ref 65–100)
Potassium: 4.1 mmol/L (ref 3.5–5.1)
Sodium: 140 mmol/L (ref 136–145)
Total Bilirubin: 0.7 mg/dL (ref 0.2–1.0)
Total Protein: 7.4 g/dL (ref 6.4–8.2)

## 2022-04-05 LAB — LIPASE: Lipase: 44 U/L — ABNORMAL LOW (ref 73–393)

## 2022-04-05 LAB — POC PREGNANCY UR-QUAL: Preg Test, Ur: NEGATIVE

## 2022-04-05 MED ORDER — IOPAMIDOL 76 % IV SOLN
76 % | Freq: Once | INTRAVENOUS | Status: AC | PRN
Start: 2022-04-05 — End: 2022-04-05
  Administered 2022-04-05: 15:00:00 100 mL via INTRAVENOUS

## 2022-04-05 MED ORDER — SODIUM CHLORIDE 0.9 % IV BOLUS
0.9 % | Freq: Once | INTRAVENOUS | Status: AC
Start: 2022-04-05 — End: 2022-04-05
  Administered 2022-04-05: 14:00:00 1000 mL via INTRAVENOUS

## 2022-04-05 MED ORDER — KETOROLAC TROMETHAMINE 15 MG/ML IJ SOLN
15 MG/ML | INTRAMUSCULAR | Status: AC
Start: 2022-04-05 — End: 2022-04-05
  Administered 2022-04-05: 14:00:00 15 mg via INTRAVENOUS

## 2022-04-05 MED ORDER — ONDANSETRON HCL 4 MG/2ML IJ SOLN
4 MG/2ML | Freq: Once | INTRAMUSCULAR | Status: AC
Start: 2022-04-05 — End: 2022-04-05
  Administered 2022-04-05: 14:00:00 4 mg via INTRAVENOUS

## 2022-04-05 MED FILL — ISOVUE-370 76 % IV SOLN: 76 % | INTRAVENOUS | Qty: 100

## 2022-04-05 MED FILL — SODIUM CHLORIDE 0.9 % IV SOLN: 0.9 % | INTRAVENOUS | Qty: 1000

## 2022-04-05 NOTE — ED Triage Notes (Signed)
Patient arrives to the ED with abd pain N/D that  started around 0430 this morning. Has scheduled Endo and colonoscopy on the 20th with Dr. Jeanne Ivan.

## 2022-04-05 NOTE — Discharge Instructions (Addendum)
Thank you!  Thank you for allowing me to care for you in the emergency department. It is my goal to provide you with excellent care. If you have not received excellent quality care, please ask to speak to the nurse manager. Please fill out the survey that will come to you by mail or email since we listen to your feedback!     Below you will find a list of your tests from today's visit.  Should you have any questions, please do not hesitate to call the emergency department.    Labs  Recent Results (from the past 12 hour(s))   CBC with Auto Differential    Collection Time: 04/05/22  9:46 AM   Result Value Ref Range    WBC 8.0 3.6 - 11.0 K/uL    RBC 4.03 3.80 - 5.20 M/uL    Hemoglobin 12.8 11.5 - 16.0 g/dL    Hematocrit 37.9 35.0 - 47.0 %    MCV 94.0 80.0 - 99.0 FL    MCH 31.8 26.0 - 34.0 PG    MCHC 33.8 30.0 - 36.5 g/dL    RDW 13.3 11.5 - 14.5 %    Platelets 218 150 - 400 K/uL    MPV 9.9 8.9 - 12.9 FL    Nucleated RBCs 0.0 0.0 PER 100 WBC    nRBC 0.00 0.00 - 0.01 K/uL    Neutrophils % 73 32 - 75 %    Lymphocytes % 20 12 - 49 %    Monocytes % 5 5 - 13 %    Eosinophils % 1 0 - 7 %    Basophils % 1 0 - 1 %    Immature Granulocytes 0 0 - 0.5 %    Neutrophils Absolute 5.9 1.8 - 8.0 K/UL    Lymphocytes Absolute 1.6 0.8 - 3.5 K/UL    Monocytes Absolute 0.4 0.0 - 1.0 K/UL    Eosinophils Absolute 0.1 0.0 - 0.4 K/UL    Basophils Absolute 0.0 0.0 - 0.1 K/UL    Absolute Immature Granulocyte 0.0 0.00 - 0.04 K/UL    Differential Type AUTOMATED     CMP    Collection Time: 04/05/22  9:46 AM   Result Value Ref Range    Sodium 140 136 - 145 mmol/L    Potassium 4.1 3.5 - 5.1 mmol/L    Chloride 105 97 - 108 mmol/L    CO2 28 21 - 32 mmol/L    Anion Gap 7 5 - 15 mmol/L    Glucose 91 65 - 100 mg/dL    BUN 6 6 - 20 mg/dL    Creatinine 0.99 0.55 - 1.02 mg/dL    Bun/Cre Ratio 6 (L) 12 - 20      Est, Glom Filt Rate >60 >60 ml/min/1.72m    Calcium 9.2 8.5 - 10.1 mg/dL    Total Bilirubin 0.7 0.2 - 1.0 mg/dL    AST 11 (L) 15 - 37 U/L    ALT  13 12 - 78 U/L    Alk Phosphatase 43 (L) 45 - 117 U/L    Total Protein 7.4 6.4 - 8.2 g/dL    Albumin 3.7 3.5 - 5.0 g/dL    Globulin 3.7 2.0 - 4.0 g/dL    Albumin/Globulin Ratio 1.0 (L) 1.1 - 2.2     Lipase    Collection Time: 04/05/22  9:46 AM   Result Value Ref Range    Lipase 44 (L) 73 - 393 U/L   Urinalysis with Reflex  to Culture    Collection Time: 04/05/22  9:46 AM    Specimen: Urine   Result Value Ref Range    Color, UA Yellow/Straw      Appearance Clear Clear      Specific Gravity, UA 1.014 1.003 - 1.030      pH, Urine 7.0 5.0 - 8.0      Protein, UA Negative Negative mg/dL    Glucose, UA Negative Negative mg/dL    Ketones, Urine Negative Negative mg/dL    Bilirubin Urine Negative Negative      Blood, Urine Negative Negative      Urobilinogen, Urine 0.1 0.1 - 1.0 EU/dL    Nitrite, Urine Negative Negative      Leukocyte Esterase, Urine Negative Negative      WBC, UA 0-4 0 - 4 /hpf    RBC, UA 0-5 0 - 5 /hpf    Epithelial Cells UA Few Few /lpf    BACTERIA, URINE 1+ (A) Negative /hpf    Urine Culture if Indicated Culture not indicated by UA result Culture not indicated by UA result     POC Pregnancy Urine Qual    Collection Time: 04/05/22  9:58 AM   Result Value Ref Range    Preg Test, Ur Negative Negative         Radiologic Studies  CT ABDOMEN PELVIS W IV CONTRAST Additional Contrast? None   Final Result   No acute abnormality        ------------------------------------------------------------------------------------------------------------  The exam and treatment you received in the Emergency Department were for an urgent problem and are not intended as complete care. It is important that you follow-up with a doctor, nurse practitioner, or physician assistant to:  (1) confirm your diagnosis,  (2) re-evaluation of changes in your illness and treatment, and  (3) for ongoing care. Please take your discharge instructions with you when you go to your follow-up appointment.     If you have any problem arranging a  follow-up appointment, contact the Emergency Department.  If your symptoms become worse or you do not improve as expected and you are unable to reach your health care provider, please return to the Emergency Department. We are available 24 hours a day.     If a prescription has been provided, please have it filled as soon as possible to prevent a delay in treatment. If you have any questions or reservations about taking the medication due to side effects or interactions with other medications, please call your primary care provider or contact the ER.

## 2022-04-05 NOTE — ED Provider Notes (Signed)
[facility] EM[facility] DEPT  EMERGENCY DEPARTMENT HISTORY AND PHYSICAL EXAM      Date: 04/05/2022  Patient Name: Shannon Acevedo  MRN: [id number]  Birthdate: 1990-11-01  Date of evaluation: 04/05/2022  Provider: Driscilla Grammes, APRN - NP   Note Started: 9:50 AM EDT 04/05/22    HISTORY OF PRESENT ILLNESS     Chief Complaint   Patient presents with    Abdominal Pain       History Provided By: Patient    HPI: Shannon Acevedo is a 31 y.o. female with past medical history as listed below, presents ambulatory to the emergency department with complaints of right periumbilical/right upper quadrant abdominal pain and bloating for about 10 days.  Reports intermittent issues with nausea ,vomiting, diarrhea, and left lower abdominal pain since May.  She had a right upper quadrant abdominal ultrasound in June which did not show any gallstones or gallbladder wall thickening and has an upcoming EGD and colonoscopy scheduled with Dr. Jeanne Ivan on 9/28.  Patient states 2 days pain is in a new location than it has been, which is what prompted her visit to the emergency department.  Denies fever/chills, chest pain, difficulty breathing,    PAST MEDICAL HISTORY   Past Medical History:  Past Medical History:   Diagnosis Date    Abnormal Pap smear of cervix     Anemia     Anxiety     Arrhythmia     Bipolar 1 disorder (HCC)     Depression     Drug abuse (HCC)     HPV (human papilloma virus) infection     LGSIL on Pap smear of cervix     Ovarian cyst     Trauma        Past Surgical History:  No past surgical history on file.    Family History:  Family History   Problem Relation Age of Onset    Asthma Mother     Alcohol Abuse Father     Psychiatric Disorder Father     Asthma Brother     Cancer Paternal Grandmother     Heart Disease Paternal Grandfather        Social History:  Social History     Tobacco Use    Smoking status: Every Day     Packs/day: 0.50     Years: 10.00     Pack years: 5.00     Types: Cigarettes    Smokeless tobacco: Never   Substance Use  Topics    Alcohol use: Yes     Alcohol/week: 6.0 standard drinks     Types: 6 Cans of beer per week    Drug use: Not Currently     Types: Marijuana Sheran Fava)       Allergies:  Allergies   Allergen Reactions    Penicillins Anaphylaxis     Anaphylaxis  Anaphylaxis         PCP: HEATHER NICOLE DARDEN, APRN - NP    Current Meds:   No current facility-administered medications for this encounter.     Current Outpatient Medications   Medication Sig Dispense Refill    norgestimate-ethinyl estradiol (ESTARYLLA) 0.25-35 MG-MCG per tablet TAKE 1 TABLET BY MOUTH DAILY 84 tablet 0    buPROPion (WELLBUTRIN SR) 150 MG extended release tablet Take 1 tablet by mouth 2 times daily 60 tablet 3    ascorbic acid (VITAMIN C) 250 MG tablet Take 1 tablet by mouth daily      drospirenone-ethinyl estradiol (YAZ)  3-0.02 MG per tablet Take 1 active tablet daily discarding placebos for menstrual suppression      ferrous sulfate (FE TABS 325) 325 (65 Fe) MG EC tablet Take 1 tablet by mouth      methocarbamol (ROBAXIN) 500 MG tablet Take 1 tablet by mouth 2 times daily         Social Determinants of Health:   Social Determinants of Health     Tobacco Use: High Risk    Smoking Tobacco Use: Every Day    Smokeless Tobacco Use: Never    Passive Exposure: Not on file   Alcohol Use: Not At Risk    Frequency of Alcohol Consumption: Never    Average Number of Drinks: Patient does not drink    Frequency of Binge Drinking: Never   Physicist, medical Strain: Not on file   Food Insecurity: Not on file   Transportation Needs: Not on file   Physical Activity: Not on file   Stress: Not on file   Social Connections: Not on file   Intimate Partner Violence: Not on file   Depression: Not on file   Housing Stability: Not on file       PHYSICAL EXAM   Physical Exam  Constitutional:       General: She is not in acute distress.     Appearance: Normal appearance. She is normal weight. She is not ill-appearing, toxic-appearing or diaphoretic.   Cardiovascular:      Rate and  Rhythm: Normal rate and regular rhythm.      Pulses: Normal pulses.      Heart sounds: Normal heart sounds.   Pulmonary:      Effort: Pulmonary effort is normal.      Breath sounds: Normal breath sounds.   Abdominal:      General: Abdomen is flat. Bowel sounds are normal. There is no distension.      Palpations: Abdomen is soft. There is no mass.      Tenderness: There is abdominal tenderness (RUQ and right periumbilical) in the right upper quadrant. There is no right CVA tenderness, left CVA tenderness, guarding or rebound.      Hernia: No hernia is present.   Musculoskeletal:      Right lower leg: No edema.      Left lower leg: No edema.   Skin:     General: Skin is warm and dry.      Coloration: Skin is not jaundiced or pale.      Findings: No bruising, erythema, lesion or rash.   Neurological:      Mental Status: She is alert and oriented to person, place, and time.   Psychiatric:         Mood and Affect: Mood normal.         Behavior: Behavior normal.         Thought Content: Thought content normal.         Judgment: Judgment normal.         SCREENINGS              LAB, EKG AND DIAGNOSTIC RESULTS   Labs:  No results found for this or any previous visit (from the past 12 hour(s)).    EKG: Not Applicable    Radiologic Studies:  Non-plain film images such as CT, Ultrasound and MRI are read by the radiologist. Plain radiographic images are visualized and preliminarily interpreted by the ED Physician with the following findings: Not Applicable.    Interpretation  per the Radiologist below, if available at the time of this note:  CT ABDOMEN PELVIS W IV CONTRAST Additional Contrast? None   Final Result   No acute abnormality           ED COURSE and DIFFERENTIAL DIAGNOSIS/MDM   CC/HPI Summary, DDx, ED Course, and Reassessment.    Disposition Considerations (Tests not done, Shared Decision Making, Pt Expectation of Test or Treatment.):     Pt presents with complaints of right periumbilical/right upper quadrant abdominal  pain and bloating for about 10 days.  Reports intermittent issues with nausea ,vomiting, diarrhea, and left lower abdominal pain since May.    Ultrasound in June was negative for gallstones and there is no gallbladder wall thickening, no indication to repeat this at this time.  She has not yet had a CT of her abdomen, will perform.  Will obtain labs, UA to evaluate for pancreatitis, appendicitis, colitis, diverticulitis, acute cystitis, concern for cholecystitis, leukocytosis, electrolyte abnormalities, renal/liver dysfunction. Ddx: gastroenteritis, gastritis, PUD.      See ED course for further documentation of pt encounter.     Records Reviewed (source and summary of external notes): Prior medical records and Nursing notes    Vitals:    Vitals:    04/05/22 0913   BP: 120/73   Pulse: 96   Resp: 16   Temp: 98.4 F (36.9 C)   TempSrc: Oral   SpO2: 99%   Weight: 59 kg (130 lb)   Height: 1.676 m (5\' 6" )        ED COURSE  ED Course as of 04/10/22 0916   Thu Apr 05, 2022   1144 Work-up is unremarkable, discussed findings with patient.  No significant improvement after pain and nausea medication.  Advised her to follow-up with her gastroenterologist and to make sure she keeps her appointment for her EGD and colonoscopy.  Will discharge home with strict return precautions. [LM]      ED Course User Index  [LM] Apr 07, 2022, APRN - NP       Patient was given the following medications:  Medications   ketorolac (TORADOL) injection 15 mg (15 mg IntraVENous Given 04/05/22 1005)   sodium chloride 0.9 % bolus 1,000 mL (0 mLs IntraVENous Stopped 04/05/22 1143)   ondansetron (ZOFRAN) injection 4 mg (4 mg IntraVENous Given 04/05/22 1004)   iopamidol (ISOVUE-370) 76 % injection 100 mL (100 mLs IntraVENous Given 04/05/22 1052)       CONSULTS: (Who and What was discussed)  None     Social Determinants affecting Dx or Tx: None    Smoking Cessation: Not Applicable    PROCEDURES   Unless otherwise noted above, none.  Procedures      CRITICAL  CARE TIME   Patient does not meet Critical Care Time, 0 minutes    FINAL IMPRESSION     1. Abdominal pain, right upper quadrant          DISPOSITION/PLAN   DISPOSITION Decision To Discharge 04/05/2022 11:47:27 AM    Discharge Note: The patient is stable for discharge home. The signs, symptoms, diagnosis, and discharge instructions have been discussed, understanding conveyed, and agreed upon. The patient is to follow up as recommended or return to ER should their symptoms worsen.      PATIENT REFERRED TO:  04/07/2022, MD  [address]  [address]  [place]  [phone]    Schedule an appointment as soon as possible for a visit  Re-evaluation        DISCHARGE MEDICATIONS:     Medication List        ASK your doctor about these medications      ascorbic acid 250 MG tablet  Commonly known as: VITAMIN C     buPROPion 150 MG extended release tablet  Commonly known as: WELLBUTRIN SR  Take 1 tablet by mouth 2 times daily     drospirenone-ethinyl estradiol 3-0.02 MG per tablet  Commonly known as: YAZ     Estarylla 0.25-35 MG-MCG per tablet  Generic drug: norgestimate-ethinyl estradiol  TAKE 1 TABLET BY MOUTH DAILY     ferrous sulfate 325 (65 Fe) MG EC tablet  Commonly known as: FE TABS 325     methocarbamol 500 MG tablet  Commonly known as: ROBAXIN                DISCONTINUED MEDICATIONS:  Discharge Medication List as of 04/05/2022 12:50 PM          I am the Primary Clinician of Record: Driscilla Grammes, APRN - NP (electronically signed)    (Please note that parts of this dictation were completed with voice recognition software. Quite often unanticipated grammatical, syntax, homophones, and other interpretive errors are inadvertently transcribed by the computer software. Please disregards these errors. Please excuse any errors that have escaped final proofreading.)     Driscilla Grammes, APRN - NP  04/10/22 [phone]

## 2022-04-12 NOTE — Telephone Encounter (Signed)
Spoke with patient who contacted the office reports has been bleeding for last 5 days with cramping and passing skin like material.  Reports alternating Motrin and Naproxen and nothing is helping reports pain is a 7. Reports that cycle had stopped up until 5 days ago.  Please review and advise.

## 2022-04-13 NOTE — Telephone Encounter (Signed)
Spoke with patient advised patient of recommendations from Dr. Morene Rankins.  She verbalized understanding.

## 2022-04-13 NOTE — Telephone Encounter (Signed)
Called patient back to give update.  Left voicemail for patient to call back to the office.

## 2022-04-23 NOTE — Patient Instructions (Signed)
Called patient at [phone]. Reviewed detailed pre-procedure instructions. Rayvon Char to take her home day of procedure.   Arrival time of 0900 given.

## 2022-04-25 ENCOUNTER — Inpatient Hospital Stay: Payer: Medicaid (Managed Care) | Attending: Gastroenterology

## 2022-04-25 LAB — POC PREGNANCY UR-QUAL: Preg Test, Ur: NEGATIVE

## 2022-04-25 MED ORDER — GLYCOPYRROLATE 0.4 MG/2ML IJ SOLN
0.4 MG/2ML | INTRAMUSCULAR | Status: DC | PRN
Start: 2022-04-25 — End: 2022-04-25
  Administered 2022-04-25: 15:00:00 .1 via INTRAVENOUS

## 2022-04-25 MED ORDER — LACTATED RINGERS IV SOLN
INTRAVENOUS | Status: DC
Start: 2022-04-25 — End: 2022-04-25
  Administered 2022-04-25: 15:00:00 via INTRAVENOUS

## 2022-04-25 MED ORDER — PROPOFOL 200 MG/20ML IV EMUL
200 MG/20ML | INTRAVENOUS | Status: DC | PRN
Start: 2022-04-25 — End: 2022-04-25
  Administered 2022-04-25 (×9): 50 via INTRAVENOUS

## 2022-04-25 MED ORDER — GLYCOPYRROLATE 0.4 MG/2ML IJ SOLN
0.4 MG/2ML | INTRAMUSCULAR | Status: AC
Start: 2022-04-25 — End: ?

## 2022-04-25 MED ORDER — LACTATED RINGERS IV SOLN
INTRAVENOUS | Status: DC | PRN
Start: 2022-04-25 — End: 2022-04-25
  Administered 2022-04-25: 15:00:00 via INTRAVENOUS

## 2022-04-25 MED ORDER — PROPOFOL 200 MG/20ML IV EMUL
200 MG/20ML | INTRAVENOUS | Status: AC
Start: 2022-04-25 — End: ?

## 2022-04-25 MED FILL — LACTATED RINGERS IV SOLN: INTRAVENOUS | Qty: 1000

## 2022-04-25 MED FILL — PROPOFOL 200 MG/20ML IV EMUL: 200 MG/20ML | INTRAVENOUS | Qty: 20

## 2022-04-25 MED FILL — GLYCOPYRROLATE 0.4 MG/2ML IJ SOLN: 0.4 MG/2ML | INTRAMUSCULAR | Qty: 2

## 2022-04-25 NOTE — Anesthesia Pre-Procedure Evaluation (Addendum)
Department of Anesthesiology  Preprocedure Note       Name:  Shannon Acevedo   Age:  31 y.o.  DOB:  [birth year]                                          MRN:  [id number]         Date:  04/25/2022      Surgeon: Juliann Mule):  Gwenlyn Fudge, MD    Procedure: Procedure(s):  EGD, COLONOSCOPY  COLONOSCOPY DIAGNOSTIC    Medications prior to admission:   Prior to Admission medications    Medication Sig Start Date End Date Taking? Authorizing Provider   omeprazole (PRILOSEC) 10 MG delayed release capsule Take 1 capsule by mouth daily 2 TABS   Yes Historical Provider, MD   norgestimate-ethinyl estradiol (ESTARYLLA) 0.25-35 MG-MCG per tablet TAKE 1 TABLET BY MOUTH DAILY 02/14/22   Zvonko Spasic, MD   buPROPion (WELLBUTRIN SR) 150 MG extended release tablet Take 1 tablet by mouth 2 times daily 01/18/22 04/25/22  Zvonko Spasic, MD   ascorbic acid (VITAMIN C) 250 MG tablet Take 1 tablet by mouth daily    Ar Automatic Reconciliation   drospirenone-ethinyl estradiol (YAZ) 3-0.02 MG per tablet Take 1 active tablet daily discarding placebos for menstrual suppression  Patient not taking: Reported on 04/25/2022 05/30/21   Ar Automatic Reconciliation   ferrous sulfate (FE TABS 325) 325 (65 Fe) MG EC tablet Take 1 tablet by mouth    Ar Automatic Reconciliation   methocarbamol (ROBAXIN) 500 MG tablet Take 1 tablet by mouth 2 times daily  Patient not taking: Reported on 04/25/2022 09/30/20   Ar Automatic Reconciliation       Current medications:    Current Facility-Administered Medications   Medication Dose Route Frequency Provider Last Rate Last Admin   . lactated ringers IV soln infusion   IntraVENous Continuous Gwenlyn Fudge, MD 50 mL/hr at 04/25/22 1049 New Bag at 04/25/22 1049       Allergies:    Allergies   Allergen Reactions   . Penicillins Anaphylaxis     Anaphylaxis  Anaphylaxis         Problem List:    Patient Active Problem List   Diagnosis Code   . Opioid abuse ([place]) F11.10   . ASCUS with positive high risk HPV cervical R87.610, R87.810    . Ovarian cyst N83.209       Past Medical History:        Diagnosis Date   . Abnormal Pap smear of cervix    . Anemia    . Anxiety    . Arrhythmia    . Bipolar 1 disorder ([place])    . Depression    . Drug abuse ([place])    . HPV (human papilloma virus) infection    . LGSIL on Pap smear of cervix    . Ovarian cyst    . Trauma        Past Surgical History:  History reviewed. No pertinent surgical history.    Social History:    Social History     Tobacco Use   . Smoking status: Every Day     Packs/day: 0.50     Years: 10.00     Additional pack years: 0.00     Total pack years: 5.00     Types: Cigarettes   . Smokeless tobacco: Never   Substance  Use Topics   . Alcohol use: Yes     Alcohol/week: 6.0 standard drinks of alcohol     Types: 6 Cans of beer per week     Comment: on weekends/socially                                Ready to quit: Not Answered  Counseling given: Not Answered      Vital Signs (Current):   Vitals:    04/23/22 1414 04/25/22 1044   BP:  110/80   Pulse:  69   Resp:  20   Temp:  98.1 F (36.7 C)   TempSrc:  Oral   SpO2:  100%   Weight: 59 kg (130 lb)    Height:  1.676 m (_0 )                                              BP Readings from Last 3 Encounters:   04/25/22 110/80   04/05/22 120/73   03/23/22 113/65       NPO Status: Time of last liquid consumption: 0800                        Time of last solid consumption: 2100                        Date of last liquid consumption: 04/25/22                        Date of last solid food consumption: 04/23/22    BMI:   Wt Readings from Last 3 Encounters:   04/23/22 59 kg (130 lb)   04/05/22 59 kg (130 lb)   03/23/22 57.2 kg (126 lb)     Body mass index is 20.98 kg/m.    CBC:   Lab Results   Component Value Date/Time    WBC 8.0 04/05/2022 09:46 AM    RBC 4.03 04/05/2022 09:46 AM    HGB 12.8 04/05/2022 09:46 AM    HCT 37.9 04/05/2022 09:46 AM    MCV 94.0 04/05/2022 09:46 AM    RDW 13.3 04/05/2022 09:46 AM    PLT 218 04/05/2022 09:46 AM       CMP:   Lab Results    Component Value Date/Time    NA 140 04/05/2022 09:46 AM    K 4.1 04/05/2022 09:46 AM    CL 105 04/05/2022 09:46 AM    CO2 28 04/05/2022 09:46 AM    BUN 6 04/05/2022 09:46 AM    CREATININE 0.99 04/05/2022 09:46 AM    GFRAA >60 02/10/2020 03:00 PM    AGRATIO 0.9 02/10/2020 03:00 PM    LABGLOM >60 04/05/2022 09:46 AM    GLUCOSE 91 04/05/2022 09:46 AM    PROT 7.4 04/05/2022 09:46 AM    CALCIUM 9.2 04/05/2022 09:46 AM    BILITOT 0.7 04/05/2022 09:46 AM    ALKPHOS 43 04/05/2022 09:46 AM    ALKPHOS 54 02/10/2020 03:00 PM    AST 11 04/05/2022 09:46 AM    ALT 13 04/05/2022 09:46 AM       POC Tests: No results for input(s): "POCGLU", "POCNA", "POCK", "POCCL", "POCBUN", "POCHEMO", "POCHCT" in the last 72 hours.  Coags: No results found for: "PROTIME", "INR", "APTT"    HCG (If Applicable):   Lab Results   Component Value Date    PREGTESTUR Negative 04/25/2022        ABGs: No results found for: "PHART", "PO2ART", "PCO2ART", "HCO3ART", "BEART", "O2SATART"     Type & Screen (If Applicable):  No results found for: "LABABO", "LABRH"    Drug/Infectious Status (If Applicable):  No results found for: "HIV", "HEPCAB"    COVID-19 Screening (If Applicable): No results found for: "COVID19"        Anesthesia Evaluation  Patient summary reviewed and Nursing notes reviewed no history of anesthetic complications:   Airway: Mallampati: I  TM distance: >3 FB   Neck ROM: full  Mouth opening: > = 3 FB   Dental: normal exam         Pulmonary:normal exam  breath sounds clear to auscultation  (+) current smoker                          ROS comment: Smokes 5-7 cigarettes/day  Smokes marijuana regularly   Cardiovascular:Negative CV ROS            Rhythm: regular  Rate: normal                    Neuro/Psych:   Negative Neuro/Psych ROS              GI/Hepatic/Renal:   (+) GERD:, bowel prep,          ROS comment: Persistent Abdominal Pain along with diarrhea .   Endo/Other: Negative Endo/Other ROS                    Abdominal:              PE comment:  Deferred.   Vascular: negative vascular ROS.         Other Findings:           Anesthesia Plan      MAC and TIVA     ASA 2     (Standard ASA monitors: continuous EKG, BP, HR, pulse oximeter, and capnography.)  Induction: intravenous.  continuous noninvasive hemodynamic monitor  MIPS: Prophylactic antiemetics administered.  Anesthetic plan and risks discussed with patient (and family, if present.).      Plan discussed with CRNA.    Attending anesthesiologist reviewed and agrees with Preprocedure content                Donivan Scull., MD   04/25/2022

## 2022-04-25 NOTE — Anesthesia Post-Procedure Evaluation (Signed)
Department of Anesthesiology  Postprocedure Note    Patient: Shannon Acevedo  MRN: [id number]  Birthdate: 25-Apr-1991  Date of evaluation: 04/25/2022      Procedure Summary     Date: 04/25/22 Room / Location: [facility]    Anesthesia Start: 1121 Anesthesia Stop: 1153    Procedures:       EGD, COLONOSCOPY (Upper GI Region)      COLONOSCOPY DIAGNOSTIC (Lower GI Region) Diagnosis:       Melena      Nausea and vomiting, unspecified vomiting type      (Melena [K92.1])      (Nausea and vomiting, unspecified vomiting type [R11.2])    Surgeons: Gwenlyn Fudge, MD Responsible Provider: Donivan Scull., MD    Anesthesia Type: MAC ASA Status: 2          Anesthesia Type: MAC    Aldrete Phase I: Aldrete Score: 10    Aldrete Phase II:        Anesthesia Post Evaluation    Patient location during evaluation: bedside (Endoscopy Unit)  Patient participation: complete - patient participated  Level of consciousness: sleepy but conscious  Pain score: 0  Airway patency: patent  Nausea & Vomiting: no nausea and no vomiting  Complications: no  Cardiovascular status: hemodynamically stable  Respiratory status: acceptable  Hydration status: stable  Comments: This patient remained on the stretcher.  The patient was handed off to the endoscopy nursing team.  All questions regarding pre-, intra-, and postoperative care were answered.  Multimodal analgesia pain management approach

## 2022-04-25 NOTE — Progress Notes (Signed)
Patient states okay to review and give discharge instructions to [facility]

## 2022-04-25 NOTE — Discharge Instructions (Signed)
-    Resume previous diet.         -  Return to endoscopist in 4 weeks            Return to normal activities tomorrow.

## 2022-04-25 NOTE — Other (Signed)
Patient alert and oriented x 4 with respirations even and unlabored on RA. Patient discharged home per physician's order. Patient and patient's fiance Rayvon Char verbalize understanding of patient's discharge instructions. Patient declines wheelchair transport for discharge. Patient ambulating with steady gait noted to front hospital entrance wit patient's fiance present to transport patient via private vehicle.

## 2022-05-04 ENCOUNTER — Encounter

## 2022-05-08 ENCOUNTER — Encounter

## 2022-05-08 MED ORDER — NORGESTIMATE-ETH ESTRADIOL 0.25-35 MG-MCG PO TABS
ORAL_TABLET | Freq: Every day | ORAL | 2 refills | Status: AC
Start: 2022-05-08 — End: 2022-07-10

## 2022-05-25 ENCOUNTER — Encounter

## 2022-05-29 ENCOUNTER — Encounter

## 2022-05-30 MED ORDER — BUPROPION HCL ER (SR) 150 MG PO TB12
150 MG | ORAL_TABLET | Freq: Two times a day (BID) | ORAL | 3 refills | Status: AC
Start: 2022-05-30 — End: ?

## 2022-06-11 ENCOUNTER — Encounter

## 2022-06-12 ENCOUNTER — Ambulatory Visit: Payer: MEDICAID | Primary: Family

## 2022-06-26 ENCOUNTER — Encounter: Payer: MEDICAID | Attending: Specialist | Primary: Family

## 2022-07-10 ENCOUNTER — Encounter

## 2022-07-10 MED ORDER — NORGESTIMATE-ETH ESTRADIOL 0.25-35 MG-MCG PO TABS
0.25-35- MG-MCG | ORAL_TABLET | Freq: Every day | ORAL | 2 refills | Status: AC
Start: 2022-07-10 — End: 2022-09-03

## 2022-08-08 NOTE — Telephone Encounter (Signed)
Spoke with patient advised provider would like to see her today she is unable to make it today but can come any other day this week per Patient.  Scheduled for Friday.

## 2022-08-08 NOTE — Telephone Encounter (Signed)
Patient contacted the office reports that she is having vaginal discharge with burning and odor.  She reports has had it for over a week and a half.  Wants to see if provider can send over prescription to treat.  She reports is sexually active and would like prescription sent to [facility] on [address].

## 2022-08-10 ENCOUNTER — Ambulatory Visit: Admit: 2022-08-10 | Discharge: 2022-08-10 | Payer: MEDICAID | Attending: Specialist | Primary: Family

## 2022-08-10 DIAGNOSIS — N760 Acute vaginitis: Secondary | ICD-10-CM

## 2022-08-10 MED ORDER — METRONIDAZOLE 500 MG PO TABS
500 MG | ORAL_TABLET | Freq: Two times a day (BID) | ORAL | 1 refills | Status: DC
Start: 2022-08-10 — End: 2022-08-14

## 2022-08-10 NOTE — Progress Notes (Signed)
Shannon Acevedo is a 32 y.o. female who presents today for the following:  Chief Complaint   Patient presents with    Vaginal Discharge     Pt presents with complaints of vaginal discharge and irritation after intercourse//MKeeley         Allergies   Allergen Reactions    Penicillins Anaphylaxis     Anaphylaxis  Anaphylaxis         Current Outpatient Medications   Medication Sig Dispense Refill    metroNIDAZOLE (FLAGYL) 500 MG tablet Take 1 tablet by mouth 2 times daily for 7 days 14 tablet 1    norgestimate-ethinyl estradiol (ESTARYLLA) 0.25-35 MG-MCG per tablet Take 1 tablet by mouth daily 84 tablet 2    buPROPion (WELLBUTRIN SR) 150 MG extended release tablet TAKE 1 TABLET BY MOUTH TWICE DAILY 60 tablet 3    omeprazole (PRILOSEC) 10 MG delayed release capsule Take 1 capsule by mouth daily 2 TABS      ascorbic acid (VITAMIN C) 250 MG tablet Take 1 tablet by mouth daily      drospirenone-ethinyl estradiol (YAZ) 3-0.02 MG per tablet Take 1 active tablet daily discarding placebos for menstrual suppression (Patient not taking: Reported on 04/25/2022)      ferrous sulfate (FE TABS 325) 325 (65 Fe) MG EC tablet Take 1 tablet by mouth      methocarbamol (ROBAXIN) 500 MG tablet Take 1 tablet by mouth 2 times daily (Patient not taking: Reported on 04/25/2022)       No current facility-administered medications for this visit.       Past Medical History:   Diagnosis Date    Abnormal Pap smear of cervix     Anemia     Anxiety     Arrhythmia     Bipolar 1 disorder (HCC)     Depression     Drug abuse (HCC)     HPV (human papilloma virus) infection     LGSIL on Pap smear of cervix     Ovarian cyst     Trauma        Past Surgical History:   Procedure Laterality Date    COLONOSCOPY N/A 04/25/2022    COLONOSCOPY DIAGNOSTIC performed by Gwenlyn Fudge, MD at [facility]    COLONOSCOPY N/A 04/25/2022    COLONOSCOPY BIOPSY/STOMA performed by Gwenlyn Fudge, MD at [facility] N/A 04/25/2022    EGD,  COLONOSCOPY performed by Gwenlyn Fudge, MD at [facility] ENDOSCOPY       Family History   Problem Relation Age of Onset    Asthma Mother     Alcohol Abuse Father     Psychiatric Disorder Father     Asthma Brother     Cancer Paternal Grandmother     Heart Disease Paternal Grandfather        Social History     Socioeconomic History    Marital status: Life Partner     Spouse name: Not on file    Number of children: Not on file    Years of education: Not on file    Highest education level: Not on file   Occupational History    Not on file   Tobacco Use    Smoking status: Every Day     Current packs/day: 0.50     Average packs/day: 0.5 packs/day for 10.0 years (5.0 ttl pk-yrs)     Types: Cigarettes    Smokeless tobacco: Never   Substance and  Sexual Activity    Alcohol use: Yes     Alcohol/week: 6.0 standard drinks of alcohol     Types: 6 Cans of beer per week     Comment: on weekends/socially    Drug use: Not Currently     Types: Marijuana Sherrie Mustache)    Sexual activity: Yes     Partners: Female, Female     Birth control/protection: Pill   Other Topics Concern    Not on file   Social History Narrative    Not on file     Social Determinants of Health     Financial Resource Strain: Not on file   Food Insecurity: Not on file   Transportation Needs: Not on file   Physical Activity: Not on file   Stress: Not on file   Social Connections: Not on file   Intimate Partner Violence: Not At Risk (03/21/2021)    Humiliation, Afraid, Rape, and Kick questionnaire     Fear of Current or Ex-Partner: No     Emotionally Abused: No     Physically Abused: No     Sexually Abused: No   Housing Stability: Not on file         Review of Systems     Review of Systems   Constitutional: Negative.    HENT: Negative.    Eyes: Negative.    Respiratory: Negative.    Cardiovascular: Negative.    Gastrointestinal: Negative.    Genitourinary: Negative.    Musculoskeletal: Negative.    Skin: Negative.    Neurological: Negative.    Endo/Heme/Allergies: Negative.     Psychiatric/Behavioral: Negative.      There were no vitals taken for this visit.   OBGyn Exam   Constitutional  Appearance: well-nourished, well developed, alert, in no acute distress    HENT  Head and Face: appears normal    Chest  Respiratory Effort: breathing labored    Gastrointestinal  Abdominal Examination: abdomen non-tender to palpation, normal bowel sounds, no masses present    Genitourinary  External Genitalia: normal appearance for age, no discharge present, no tenderness present, no inflammatory lesions present, no masses present, no atrophy present  Vagina: normal vaginal vault without central or paravaginal defects, no discharge present, no inflammatory lesions present, no masses present  Bladder: non-tender to palpation  Urethra: appears normal  Cervix: normal   Uterus: normal size, shape and consistency  Adnexa: no adnexal tenderness present, no adnexal masses present  Perineum: perineum within normal limits, no evidence of trauma, no rashes or skin lesions present  Anus: anus within normal limits, no hemorrhoids present  Inguinal Lymph Nodes: no lymphadenopathy present    Skin  General Inspection: no rash, no lesions identified    Neurologic/Psychiatric  Mental Status:  Orientation: grossly oriented to person, place and time  Mood and Affect: mood normal, affect appropriate        No orders of the defined types were placed in this encounter.    [place]    1. Acute vaginitis    - metroNIDAZOLE (FLAGYL) 500 MG tablet; Take 1 tablet by mouth 2 times daily for 7 days  Dispense: 14 tablet; Refill: 1        Return in about 6 months (around 02/08/2023).

## 2022-08-13 LAB — NUSWAB VAGINITIS PLUS (VG+) WITH CANDIDA (SIX SPECIES)
Atopobium Vaginae: HIGH Score — AB
Bacterial Vaginosis Associated Bacterium 2 DNA: HIGH Score — AB
Candida Lusitaniae, NAA: NEGATIVE
Candida Parapsilosis/Tropicalis: NEGATIVE
Candida albicans, NAA: NEGATIVE
Candida glabrata: NEGATIVE
Candida krusei: NEGATIVE
Chlamydia trachomatis, NAA: NEGATIVE
Megasphaera DNA, Vag: HIGH Score — AB
Neisseria Gonorrhoeae, NAA: NEGATIVE
Trichomonas Vaginalis by NAA: NEGATIVE

## 2022-08-14 ENCOUNTER — Encounter

## 2022-08-14 MED ORDER — METRONIDAZOLE 500 MG PO TABS
500 MG | ORAL_TABLET | Freq: Two times a day (BID) | ORAL | 1 refills | Status: AC
Start: 2022-08-14 — End: 2022-08-21

## 2022-08-14 NOTE — Telephone Encounter (Signed)
08/14/2022 @12 :35pm-Called [phone] and L/M on VM to have patient contact the office at [phone] regarding message she sent via portal to schedule an annual exam.ww

## 2022-08-14 NOTE — Telephone Encounter (Signed)
Patient called wanting to know if the prescription for Metronidazole todaye needed to be picked up as she was prescribed the same thing on 08/10/22.  Advised her she does not need to get the one from today as it is a duplicate.

## 2022-09-03 ENCOUNTER — Ambulatory Visit: Admit: 2022-09-03 | Discharge: 2022-09-03 | Payer: MEDICAID | Attending: Specialist | Primary: Family

## 2022-09-03 DIAGNOSIS — S39013A Strain of muscle, fascia and tendon of pelvis, initial encounter: Secondary | ICD-10-CM

## 2022-09-03 MED ORDER — PREMARIN 0.625 MG/GM VA CREA
0.625 MG/GM | Freq: Every day | VAGINAL | 1 refills | Status: AC
Start: 2022-09-03 — End: ?

## 2022-09-03 MED ORDER — NORGESTIMATE-ETH ESTRADIOL 0.25-35 MG-MCG PO TABS
ORAL_TABLET | Freq: Every day | ORAL | 2 refills | Status: AC
Start: 2022-09-03 — End: ?

## 2022-09-03 NOTE — Progress Notes (Signed)
Shannon Acevedo is a 32 y.o. female who presents today for the following:  Chief Complaint   Patient presents with    Other     Pt presents with complaints of pain and irritation around vagina. //MKeeley         Allergies   Allergen Reactions    Penicillins Anaphylaxis     Anaphylaxis  Anaphylaxis         Current Outpatient Medications   Medication Sig Dispense Refill    estrogens conjugated (PREMARIN) 0.625 MG/GM CREA vaginal cream Place 1.5 g vaginally daily 30 g 1    norgestimate-ethinyl estradiol (ESTARYLLA) 0.25-35 MG-MCG per tablet Take 1 tablet by mouth daily 84 tablet 2    buPROPion (WELLBUTRIN SR) 150 MG extended release tablet TAKE 1 TABLET BY MOUTH TWICE DAILY 60 tablet 3    omeprazole (PRILOSEC) 10 MG delayed release capsule Take 1 capsule by mouth daily 2 TABS      ascorbic acid (VITAMIN C) 250 MG tablet Take 1 tablet by mouth daily      drospirenone-ethinyl estradiol (YAZ) 3-0.02 MG per tablet Take 1 active tablet daily discarding placebos for menstrual suppression (Patient not taking: Reported on 04/25/2022)      ferrous sulfate (FE TABS 325) 325 (65 Fe) MG EC tablet Take 1 tablet by mouth      methocarbamol (ROBAXIN) 500 MG tablet Take 1 tablet by mouth 2 times daily (Patient not taking: Reported on 04/25/2022)       No current facility-administered medications for this visit.       Past Medical History:   Diagnosis Date    Abnormal Pap smear of cervix     Anemia     Anxiety     Arrhythmia     Bipolar 1 disorder (HCC)     Depression     Drug abuse (HCC)     HPV (human papilloma virus) infection     LGSIL on Pap smear of cervix     Ovarian cyst     Trauma        Past Surgical History:   Procedure Laterality Date    COLONOSCOPY N/A 04/25/2022    COLONOSCOPY DIAGNOSTIC performed by Gwenlyn Fudge, MD at [facility]    COLONOSCOPY N/A 04/25/2022    COLONOSCOPY BIOPSY/STOMA performed by Gwenlyn Fudge, MD at [place] N/A 04/25/2022    EGD, COLONOSCOPY performed by Gwenlyn Fudge, MD at [facility] END[facility]COPY       Family History   Problem Relation Age of Onset    Asthma Mother     Alcohol Abuse Father     Psychiatric Disorder Father     Asthma Brother     Cancer Paternal Grandmother     Heart Disease Paternal Grandfather        Social History     Socioeconomic History    Marital status: Life Partner     Spouse name: Not on file    Number of children: Not on file    Years of education: Not on file    Highest education level: Not on file   Occupational History    Not on file   Tobacco Use    Smoking status: Every Day     Current packs/day: 0.50     Average packs/day: 0.5 packs/day for 10.0 years (5.0 ttl pk-yrs)     Types: Cigarettes    Smokeless tobacco: Never   Substance and Sexual Activity  Alcohol use: Yes     Alcohol/week: 6.0 standard drinks of alcohol     Types: 6 Cans of beer per week     Comment: on weekends/socially    Drug use: Not Currently     Types: Marijuana Sherrie Mustache)    Sexual activity: Yes     Partners: Female, Female     Birth control/protection: Pill   Other Topics Concern    Not on file   Social History Narrative    Not on file     Social Determinants of Health     Financial Resource Strain: Not on file   Food Insecurity: Not on file   Transportation Needs: Not on file   Physical Activity: Not on file   Stress: Not on file   Social Connections: Not on file   Intimate Partner Violence: Not At Risk (03/21/2021)    Humiliation, Afraid, Rape, and Kick questionnaire     Fear of Current or Ex-Partner: No     Emotionally Abused: No     Physically Abused: No     Sexually Abused: No   Housing Stability: Not on file         Review of Systems     Review of Systems   Constitutional: Negative.    HENT: Negative.    Eyes: Negative.    Respiratory: Negative.    Cardiovascular: Negative.    Gastrointestinal: Negative.    Genitourinary: Negative.    Musculoskeletal: Negative.    Skin: Negative.    Neurological: Negative.    Endo/Heme/Allergies: Negative.    Psychiatric/Behavioral: Negative.       BP (!) 140/77   Ht 1.676 m (5\' 6" )   Wt 57.7 kg (127 lb 4 oz)   BMI 20.54 kg/m    OBGyn Exam   Constitutional  Appearance: well-nourished, well developed, alert, in no acute distress    HENT  Head and Face: appears normal    Chest  Respiratory Effort: breathing labored    Gastrointestinal  Abdominal Examination: abdomen non-tender to palpation, normal bowel sounds, no masses present    Genitourinary  External Genitalia: normal appearance for age, no discharge present, no tenderness present, no inflammatory lesions present, no masses present, no atrophy present  Vagina: VAGINAL TEAR, POSTERIOR FOURCHETTE   Bladder: non-tender to palpation  Urethra: appears normal  Cervix: normal   Uterus: normal size, shape and consistency  Adnexa: no adnexal tenderness present, no adnexal masses present  Perineum: perineum within normal limits, no evidence of trauma, no rashes or skin lesions present  Anus: anus within normal limits, no hemorrhoids present  Inguinal Lymph Nodes: no lymphadenopathy present    Skin  General Inspection: no rash, no lesions identified    Neurologic/Psychiatric  Mental Status:  Orientation: grossly oriented to person, place and time  Mood and Affect: mood normal, affect appropriate        No orders of the defined types were placed in this encounter.    31 YO WITH POSTERIOR VAG TEAR    1. Tear of vaginal muscle, initial encounter    - estrogens conjugated (PREMARIN) 0.625 MG/GM CREA vaginal cream; Place 1.5 g vaginally daily  Dispense: 30 g; Refill: 1    2. Non-obstetric vaginal laceration with perineal laceration without foreign body, initial encounter    - estrogens conjugated (PREMARIN) 0.625 MG/GM CREA vaginal cream; Place 1.5 g vaginally daily  Dispense: 30 g; Refill: 1        Return in about 4 weeks (around 10/01/2022).

## 2022-09-03 NOTE — Addendum Note (Signed)
Addended byBiagio Quint on: 09/03/2022 01:49 PM     Modules accepted: Orders

## 2022-10-22 ENCOUNTER — Inpatient Hospital Stay
Admit: 2022-10-22 | Discharge: 2022-10-22 | Disposition: A | Discharge status: Home / Self Care | Payer: MEDICAID | Attending: Emergency Medicine

## 2022-10-22 DIAGNOSIS — J069 Acute upper respiratory infection, unspecified: Secondary | ICD-10-CM

## 2022-10-22 NOTE — ED Notes (Signed)
Discharge instructions reviewed with patient and questions answered.  Pt ambulatory out of department in no acute distress.

## 2022-10-22 NOTE — ED Provider Notes (Shared)
[facility] EMERGENCY DEPT  EMERGENCY DEPARTMENT ENCOUNTER      Pt Name: Shannon Acevedo  MRN: [id number]  Birthdate 1991-04-06  Date of evaluation: 10/22/2022  Provider: Marguerite Olea, MD    CHIEF COMPLAINT       Chief Complaint   Patient presents with    Mass         HISTORY OF PRESENT ILLNESS   (Location/Symptom, Timing/Onset, Context/Setting, Quality, Duration, Modifying Factors, Severity)  Note limiting factors.   66F w/ hx bipolar, anemia, substance abuse p/w 1wk URI like symptoms. Pt reports 1wk of cough, congestion, chills, sinus pressure and sore throat. Also notes painful lumps to her right neck. No difficulty breathing or swallowing. NO rash, redness, drainage. No N/V/D, abd or chest pain. No dental pain. Denies being immunocompromised or IVDA. Current smoker.            Review of External Medical Records:     Nursing Notes were reviewed.    REVIEW OF SYSTEMS    (2-9 systems for level 4, 10 or more for level 5)     Review of Systems   Constitutional:  Negative for diaphoresis and fever.   HENT:  Positive for sinus pressure and sore throat. Negative for nosebleeds.    Eyes:  Negative for visual disturbance.   Respiratory:  Positive for cough. Negative for shortness of breath.    Cardiovascular:  Negative for chest pain, palpitations and leg swelling.   Gastrointestinal:  Negative for abdominal distention, abdominal pain, anal bleeding, blood in stool, diarrhea, nausea and vomiting.   Endocrine: Negative for polyuria.   Genitourinary:  Negative for difficulty urinating, dysuria, frequency and hematuria.   Musculoskeletal:  Negative for joint swelling.   Skin:  Negative for wound.   Allergic/Immunologic: Negative for immunocompromised state.   Neurological:  Negative for seizures and syncope.   Hematological:  Does not bruise/bleed easily.   Psychiatric/Behavioral:  Negative for confusion.        Except as noted above the remainder of the review of systems was reviewed and negative.       PAST MEDICAL HISTORY      Past Medical History:   Diagnosis Date    Abnormal Pap smear of cervix     Anemia     Anxiety     Arrhythmia     Bipolar 1 disorder (HCC)     Depression     Drug abuse (HCC)     HPV (human papilloma virus) infection     LGSIL on Pap smear of cervix     Ovarian cyst     Trauma          SURGICAL HISTORY       Past Surgical History:   Procedure Laterality Date    COLONOSCOPY N/A 04/25/2022    COLONOSCOPY DIAGNOSTIC performed by Era Bumpers, MD at [facility] ENDOSCOPY    COLONOSCOPY N/A 04/25/2022    COLONOSCOPY BIOPSY/STOMA performed by Era Bumpers, MD at [facility] ENDOSCOPY    UPPER GASTROINTESTINAL ENDOSCOPY N/A 04/25/2022    EGD, COLONOSCOPY performed by Era Bumpers, MD at [facility] ENDOSCOPY         CURRENT MEDICATIONS       Previous Medications    ASCORBIC ACID (VITAMIN C) 250 MG TABLET    Take 1 tablet by mouth daily    BUPROPION (WELLBUTRIN SR) 150 MG EXTENDED RELEASE TABLET    TAKE 1 TABLET BY MOUTH TWICE DAILY    DROSPIRENONE-ETHINYL ESTRADIOL (YAZ) 3-0.02 MG PER TABLET  Take 1 active tablet daily discarding placebos for menstrual suppression    ESTROGENS CONJUGATED (PREMARIN) 0.625 MG/GM CREA VAGINAL CREAM    Place 1.5 g vaginally daily    FERROUS SULFATE (FE TABS 325) 325 (65 FE) MG EC TABLET    Take 1 tablet by mouth    METHOCARBAMOL (ROBAXIN) 500 MG TABLET    Take 1 tablet by mouth 2 times daily    NORGESTIMATE-ETHINYL ESTRADIOL (ESTARYLLA) 0.25-35 MG-MCG PER TABLET    Take 1 tablet by mouth daily    OMEPRAZOLE (PRILOSEC) 10 MG DELAYED RELEASE CAPSULE    Take 1 capsule by mouth daily 2 TABS       ALLERGIES     Penicillins    FAMILY HISTORY       Family History   Problem Relation Age of Onset    Asthma Mother     Alcohol Abuse Father     Psychiatric Disorder Father     Asthma Brother     Cancer Paternal Grandmother     Heart Disease Paternal Grandfather           SOCIAL HISTORY       Social History     Socioeconomic History    Marital status: Life Partner   Tobacco Use    Smoking status: Every Day     Current packs/day:  0.50     Average packs/day: 0.5 packs/day for 10.0 years (5.0 ttl pk-yrs)     Types: Cigarettes    Smokeless tobacco: Never   Substance and Sexual Activity    Alcohol use: Yes     Alcohol/week: 6.0 standard drinks of alcohol     Types: 6 Cans of beer per week     Comment: on weekends/socially    Drug use: Not Currently     Types: Marijuana Sheran Fava)    Sexual activity: Yes     Partners: Female, Female     Birth control/protection: Pill     Social Determinants of Health     Intimate Partner Violence: Not At Risk (03/21/2021)    Humiliation, Afraid, Rape, and Kick questionnaire     Fear of Current or Ex-Partner: No     Emotionally Abused: No     Physically Abused: No     Sexually Abused: No           PHYSICAL EXAM    (up to 7 for level 4, 8 or more for level 5)     ED Triage Vitals [10/22/22 1106]   BP Temp Temp Source Pulse Respirations SpO2 Height Weight - Scale   104/67 98.5 F (36.9 C) Oral 87 16 100 % 1.651 m (5\' 5" ) 59 kg (130 lb)       Body mass index is 21.63 kg/m.    Physical Exam  Constitutional:       General: She is not in acute distress.     Appearance: Normal appearance. She is not ill-appearing, toxic-appearing or diaphoretic.   HENT:      Head: Normocephalic.      Mouth/Throat:      Mouth: Mucous membranes are moist.      Pharynx: Oropharynx is clear. No oropharyngeal exudate or posterior oropharyngeal erythema.      Comments: no oropharyngeal edema/erythema/exudates  uvula midline, no signs of PTA  speaking in full sentences, no stridor, drooling, tripoding, trismus, tolerating secretions  soft, non-tender/non-edematous sublingual/submandibular space  full neck ROM  no cervical lymphadenopathy    Eyes:  General: No scleral icterus.     Extraocular Movements: Extraocular movements intact.      Conjunctiva/sclera: Conjunctivae normal.      Pupils: Pupils are equal, round, and reactive to light.   Neck:      Comments: Right cervical tender lymphadenopathy  Cardiovascular:      Rate and Rhythm: Normal  rate and regular rhythm.      Pulses: Normal pulses.      Heart sounds: No murmur heard.     No friction rub.   Pulmonary:      Effort: Pulmonary effort is normal. No respiratory distress.      Breath sounds: Normal breath sounds. No stridor. No wheezing, rhonchi or rales.   Abdominal:      General: Abdomen is flat. There is no distension.      Palpations: Abdomen is soft.      Tenderness: There is no abdominal tenderness. There is no guarding or rebound.   Musculoskeletal:         General: No swelling.      Cervical back: Normal range of motion. No rigidity.      Right lower leg: No edema.      Left lower leg: No edema.   Skin:     General: Skin is warm and dry.      Capillary Refill: Capillary refill takes less than 2 seconds.      Coloration: Skin is not jaundiced.   Neurological:      General: No focal deficit present.      Mental Status: She is alert.      Cranial Nerves: No cranial nerve deficit.      Sensory: No sensory deficit.      Motor: No weakness.      Coordination: Coordination normal.         DIAGNOSTIC RESULTS     EKG: All EKG's are interpreted by the Emergency Department Physician who either signs or Co-signs this chart in the absence of a cardiologist.      RADIOLOGY:   Interpretation per the Radiologist below, if available at the time of this note:    No orders to display        LABS:  No visits with results within 1 Day(s) from this visit.   Latest known visit with results is:   Office Visit on 08/10/2022   Component Date Value Ref Range Status    Atopobium Vaginae 08/10/2022 High - 2 (A)  Score Final    Bacterial Vaginosis Associated Bac* 08/10/2022 High - 2 (A)  Score Final    Megasphaera DNA, Vag 08/10/2022 High - 2 (A)  Score Final    Comment: Calculate total score by adding the 3 individual bacterial  vaginosis (BV) marker scores together.  Total score is  interpreted as follows:  Total score 0-1: Indicates the absence of BV.  Total score   2: Indeterminate for BV. Additional clinical                    data should be evaluated to establish a                   diagnosis.  Total score 3-6: Indicates the presence of BV.     This test was developed and its performance characteristics  determined by [facility].  It has not been cleared or approved  by the Food and Drug Administration.      Candida albicans, NAA 08/10/2022 Negative  Negative Final  Candida glabrata 08/10/2022 Negative  Negative Final    Candida Parapsilosis/Tropicalis 08/10/2022 Negative  Negative Final    This assay does not differentiate C. tropicalis and C. parapsilosis.    Candida Lusitaniae, NAA 08/10/2022 Negative  Negative Final    Candida krusei 08/10/2022 Negative  Negative Final    Trichomonas Vaginalis by NAA 08/10/2022 Negative  Negative Final    Chlamydia trachomatis, NAA 08/10/2022 Negative  Negative Final    Neisseria Gonorrhoeae, NAA 08/10/2022 Negative  Negative Final         EMERGENCY DEPARTMENT COURSE and DIFFERENTIAL DIAGNOSIS/MDM:   Vitals:    Vitals:    10/22/22 1106   BP: 104/67   Pulse: 87   Resp: 16   Temp: 98.5 F (36.9 C)   TempSrc: Oral   SpO2: 100%   Weight: 59 kg (130 lb)   Height: 1.651 m (5\' 5" )           Medical Decision Making  22F w/ hx bipolar, anemia, substance abuse p/w 1wk URI like symptoms. Pt reports 1wk of cough, congestion, chills, sinus pressure and sore throat. Also notes painful lumps to her right neck. No difficulty breathing or swallowing. NO rash, redness, drainage. No N/V/D, abd or chest pain. No dental pain. Denies being immunocompromised or IVDA. Current smoker.              ED Course            CONSULTS:  None    PROCEDURES:  Unless otherwise noted below, none     Procedures      FINAL IMPRESSION    No diagnosis found.      DISPOSITION/PLAN   DISPOSITION        PATIENT REFERRED TO:  No follow-up provider specified.    DISCHARGE MEDICATIONS:  New Prescriptions    No medications on file         Marguerite Olea, MD (electronically signed)  Emergency Attending Physician

## 2022-10-22 NOTE — ED Triage Notes (Signed)
Patient arrives to ED with c/o two lumps to her right neck. Patient states that she noticed the lumps one week ago and that they are not painful. Patient states that she did have some ear pain the the left side but that has stopped. VSS.

## 2022-10-25 ENCOUNTER — Inpatient Hospital Stay: Admit: 2022-10-25 | Discharge: 2022-10-25 | Disposition: A | Discharge status: Home / Self Care | Payer: MEDICAID

## 2022-10-25 DIAGNOSIS — J029 Acute pharyngitis, unspecified: Secondary | ICD-10-CM

## 2022-10-25 LAB — CBC WITH AUTO DIFFERENTIAL
Absolute Immature Granulocyte: 0 10*3/uL (ref 0.00–0.04)
Basophils %: 0 % (ref 0–1)
Basophils Absolute: 0 10*3/uL (ref 0.0–0.1)
Eosinophils %: 0 % (ref 0–7)
Eosinophils Absolute: 0 10*3/uL (ref 0.0–0.4)
Hematocrit: 37.3 % (ref 35.0–47.0)
Hemoglobin: 12.4 g/dL (ref 11.5–16.0)
Immature Granulocytes: 0 % (ref 0–0.5)
Lymphocytes %: 15 % (ref 12–49)
Lymphocytes Absolute: 1.5 10*3/uL (ref 0.8–3.5)
MCH: 30.3 PG (ref 26.0–34.0)
MCHC: 33.2 g/dL (ref 30.0–36.5)
MCV: 91.2 FL (ref 80.0–99.0)
MPV: 9.4 FL (ref 8.9–12.9)
Monocytes %: 7 % (ref 5–13)
Monocytes Absolute: 0.7 10*3/uL (ref 0.0–1.0)
Neutrophils %: 78 % — ABNORMAL HIGH (ref 32–75)
Neutrophils Absolute: 7.8 10*3/uL (ref 1.8–8.0)
Nucleated RBCs: 0 PER 100 WBC
Platelets: 243 10*3/uL (ref 150–400)
RBC: 4.09 M/uL (ref 3.80–5.20)
RDW: 13.1 % (ref 11.5–14.5)
WBC: 10.1 10*3/uL (ref 3.6–11.0)
nRBC: 0 10*3/uL (ref 0.00–0.01)

## 2022-10-25 LAB — RAPID INFLUENZA A/B ANTIGENS
Influenza A Ag: NEGATIVE
Influenza B Ag: NEGATIVE

## 2022-10-25 LAB — RAPID STREP SCREEN: Strep A Ag: NEGATIVE

## 2022-10-25 LAB — MONONUCLEOSIS SCREEN
Mononucleosis Screen: NEGATIVE
Negative Control: NEGATIVE
Positive Control: POSITIVE

## 2022-10-25 LAB — COVID-19, RAPID: SARS-CoV-2, Rapid: NOT DETECTED

## 2022-10-25 MED ORDER — PREDNISONE 20 MG PO TABS
20 | ORAL | Status: AC
Start: 2022-10-25 — End: 2022-10-25
  Administered 2022-10-25: 17:00:00 40 mg via ORAL

## 2022-10-25 MED ORDER — CLINDAMYCIN HCL 300 MG PO CAPS
300 | ORAL_CAPSULE | Freq: Three times a day (TID) | ORAL | 0 refills | Status: AC
Start: 2022-10-25 — End: 2022-11-04

## 2022-10-25 MED ORDER — KETOROLAC TROMETHAMINE 30 MG/ML IJ SOLN
30 | INTRAMUSCULAR | Status: AC
Start: 2022-10-25 — End: 2022-10-25
  Administered 2022-10-25: 19:00:00 30 mg via INTRAMUSCULAR

## 2022-10-25 MED ORDER — ACETAMINOPHEN 500 MG PO TABS
500 | ORAL | Status: AC
Start: 2022-10-25 — End: 2022-10-25
  Administered 2022-10-25: 17:00:00 1000 mg via ORAL

## 2022-10-25 MED ORDER — PREDNISONE 20 MG PO TABS
20 | ORAL_TABLET | Freq: Every day | ORAL | 0 refills | Status: AC
Start: 2022-10-25 — End: 2022-10-29

## 2022-10-25 MED FILL — ACETAMINOPHEN EXTRA STRENGTH 500 MG PO TABS: 500 MG | ORAL | Qty: 2

## 2022-10-25 MED FILL — KETOROLAC TROMETHAMINE 30 MG/ML IJ SOLN: 30 MG/ML | INTRAMUSCULAR | Qty: 1

## 2022-10-25 MED FILL — PREDNISONE 20 MG PO TABS: 20 MG | ORAL | Qty: 2

## 2022-10-25 NOTE — ED Triage Notes (Signed)
Pt comes in complaining of a rash on her stomach,arms, and legs x2 weeks and sore throat for the past week.

## 2022-10-25 NOTE — Discharge Instructions (Signed)
Thank you!  Thank you for allowing me to care for you in the emergency department. It is my goal to provide you with excellent care.  Please fill out the survey that will come to you by mail or email since we listen to your feedback!     Below you will find a list of your tests from today's visit.  Should you have any questions, please do not hesitate to call the emergency department.    Labs  Recent Results (from the past 12 hour(s))   COVID-19, Rapid    Collection Time: 10/25/22 11:33 AM    Specimen: Nasopharyngeal   Result Value Ref Range    SARS-CoV-2, Rapid Not Detected Not Detected     Rapid influenza A/B antigens    Collection Time: 10/25/22 11:33 AM    Specimen: Nasal Washing   Result Value Ref Range    Influenza A Ag Negative Negative      Influenza B Ag Negative Negative     Rapid Strep Screen    Collection Time: 10/25/22 11:33 AM    Specimen: Blood Serum; Throat   Result Value Ref Range    Strep A Ag Negative Negative         Radiologic Studies  No orders to display     ------------------------------------------------------------------------------------------------------------  The exam and treatment you received in the Emergency Department were for an urgent problem and are not intended as complete care. It is important that you follow-up with a doctor, nurse practitioner, or physician assistant to:  (1) confirm your diagnosis,  (2) re-evaluation of changes in your illness and treatment, and (3) for ongoing care. Please take your discharge instructions with you when you go to your follow-up appointment.     If you have any problem arranging a follow-up appointment, contact the Emergency Department.  If your symptoms become worse or you do not improve as expected and you are unable to reach your health care provider, please return to the Emergency Department. We are available 24 hours a day.     If a prescription has been provided, please have it filled as soon as possible to prevent a delay in  treatment. If you have any questions or reservations about taking the medication due to side effects or interactions with other medications, please call your primary care provider or contact the ER.

## 2022-10-25 NOTE — ED Provider Notes (Signed)
[facility]  EMERGENCY DEPARTMENT HISTORY AND PHYSICAL EXAM      Date: 10/25/2022  Patient Name: Shannon Acevedo  MRN: [id number]  [place]: 02-26-1991  Date of evaluation: 10/25/2022  Provider: Derrek Gu, PA-C   Note Started: 12:21 PM EDT 10/25/22    HISTORY OF PRESENT ILLNESS     Chief Complaint   Patient presents with    Rash    Pharyngitis       History Provided By: Patient    HPI: Shannon Acevedo is a 32 y.o. female past medical history listed below presents emerged from today with complaint of sore throat starting 1 week ago.  States that seen at [facility] for specifically.  Discharge diagnosis cervical lymphadenopathy.  Not given any antibiotics.  Comes in today for further evaluation.  Associate symptoms including fever chills body aches.  Also reports rash on torso lower extremities that started a week ago improved little and gotten a little worse as of recent.  No chest pain shortness of breath abdominal pain nausea vomiting.  Does have decreased appetite.    PAST MEDICAL HISTORY   Past Medical History:  Past Medical History:   Diagnosis Date    Abnormal Pap smear of cervix     Anemia     Anxiety     Arrhythmia     Bipolar 1 disorder ([place])     Depression     Drug abuse ([place])     HPV (human papilloma virus) infection     LGSIL on Pap smear of cervix     Ovarian cyst     Trauma        Past Surgical History:  Past Surgical History:   Procedure Laterality Date    COLONOSCOPY N/A 04/25/2022    COLONOSCOPY DIAGNOSTIC performed by Gwenlyn Fudge, MD at [facility]    COLONOSCOPY N/A 04/25/2022    COLONOSCOPY BIOPSY/STOMA performed by Gwenlyn Fudge, MD at [facility] N/A 04/25/2022    EGD, COLONOSCOPY performed by Gwenlyn Fudge, MD at [facility] ENDOSCOPY       Family History:  Family History   Problem Relation Age of Onset    Asthma Mother     Alcohol Abuse Father     Psychiatric Disorder Father     Asthma Brother     Cancer Paternal Grandmother     Heart Disease Paternal  Grandfather        Social History:  Social History     Tobacco Use    Smoking status: Every Day     Current packs/day: 0.50     Average packs/day: 0.5 packs/day for 10.0 years (5.0 ttl pk-yrs)     Types: Cigarettes    Smokeless tobacco: Never   Substance Use Topics    Alcohol use: Yes     Alcohol/week: 6.0 standard drinks of alcohol     Types: 6 Cans of beer per week     Comment: on weekends/socially    Drug use: Not Currently     Types: Marijuana Sherrie Mustache)       Allergies:  Allergies   Allergen Reactions    Penicillins Anaphylaxis     Anaphylaxis  Anaphylaxis         PCP: Marlynn Perking, APRN - NP    Current Meds:   No current facility-administered medications for this encounter.     Current Outpatient Medications   Medication Sig Dispense Refill    predniSONE (DELTASONE) 20 MG tablet  Take 2 tablets by mouth daily for 4 days 8 tablet 0    clindamycin (CLEOCIN) 300 MG capsule Take 1 capsule by mouth 3 times daily for 10 days 30 capsule 0    estrogens conjugated (PREMARIN) 0.625 MG/GM CREA vaginal cream Place 1.5 g vaginally daily 30 g 1    norgestimate-ethinyl estradiol (ESTARYLLA) 0.25-35 MG-MCG per tablet Take 1 tablet by mouth daily 84 tablet 2    buPROPion (WELLBUTRIN SR) 150 MG extended release tablet TAKE 1 TABLET BY MOUTH TWICE DAILY 60 tablet 3    omeprazole (PRILOSEC) 10 MG delayed release capsule Take 1 capsule by mouth daily 2 TABS      ascorbic acid (VITAMIN C) 250 MG tablet Take 1 tablet by mouth daily      drospirenone-ethinyl estradiol (YAZ) 3-0.02 MG per tablet Take 1 active tablet daily discarding placebos for menstrual suppression (Patient not taking: Reported on 04/25/2022)      ferrous sulfate (FE TABS 325) 325 (65 Fe) MG EC tablet Take 1 tablet by mouth      methocarbamol (ROBAXIN) 500 MG tablet Take 1 tablet by mouth 2 times daily (Patient not taking: Reported on 04/25/2022)         Social Determinants of Health:   Social Determinants of Health     Tobacco Use: High Risk (10/25/2022)     Patient History     Smoking Tobacco Use: Every Day     Smokeless Tobacco Use: Never     Passive Exposure: Not on file   Alcohol Use: Not At Risk (10/25/2022)    AUDIT-C     Frequency of Alcohol Consumption: Never     Average Number of Drinks: Patient does not drink     Frequency of Binge Drinking: Never   Financial Resource Strain: Not on file   Food Insecurity: Not on file   Transportation Needs: Not on file   Physical Activity: Not on file   Stress: Not on file   Social Connections: Not on file   Intimate Partner Violence: Not At Risk (03/21/2021)    Humiliation, Afraid, Rape, and Kick questionnaire     Fear of Current or Ex-Partner: No     Emotionally Abused: No     Physically Abused: No     Sexually Abused: No   Depression: Not at risk (03/24/2021)    PHQ-2     PHQ-2 Score: 0   Housing Stability: Not on file   Interpersonal Safety: Not on file   Utilities: Not on file       PHYSICAL EXAM   Physical Exam  Constitutional:       Appearance: Normal appearance. She is well-developed.   HENT:      Head: Normocephalic and atraumatic.      Right Ear: Tympanic membrane, ear canal and external ear normal.      Left Ear: Tympanic membrane, ear canal and external ear normal.      Nose: Nose normal.      Mouth/Throat:      Mouth: Mucous membranes are moist.      Pharynx: Oropharynx is clear. Uvula midline. Posterior oropharyngeal erythema present.   Eyes:      Extraocular Movements: Extraocular movements intact.      Pupils: Pupils are equal, round, and reactive to light.   Cardiovascular:      Rate and Rhythm: Normal rate and regular rhythm.      Pulses: Normal pulses.      Heart sounds: Normal heart sounds.  No murmur heard.  Pulmonary:      Effort: Pulmonary effort is normal. No respiratory distress.      Breath sounds: Normal breath sounds.   Abdominal:      General: Abdomen is flat. Bowel sounds are normal. There is no distension.      Palpations: Abdomen is soft.   Musculoskeletal:         General: Normal range of motion.       Cervical back: Normal range of motion.   Skin:     General: Skin is warm and dry.      Findings: Rash present. Rash is macular (Trunk and lower extremities).   Neurological:      General: No focal deficit present.      Mental Status: She is alert and oriented to person, place, and time.   Psychiatric:         Mood and Affect: Mood normal.         Behavior: Behavior normal.           SCREENINGS                  LAB, EKG AND DIAGNOSTIC RESULTS   Labs:  Recent Results (from the past 12 hour(s))   COVID-19, Rapid    Collection Time: 10/25/22 11:33 AM    Specimen: Nasopharyngeal   Result Value Ref Range    SARS-CoV-2, Rapid Not Detected Not Detected     Rapid influenza A/B antigens    Collection Time: 10/25/22 11:33 AM    Specimen: Nasal Washing   Result Value Ref Range    Influenza A Ag Negative Negative      Influenza B Ag Negative Negative     Rapid Strep Screen    Collection Time: 10/25/22 11:33 AM    Specimen: Blood Serum; Throat   Result Value Ref Range    Strep A Ag Negative Negative     Mononucleosis Screen    Collection Time: 10/25/22  1:51 PM   Result Value Ref Range    Mononucleosis Screen Negative Negative      Positive Control Positive      Negative Control Negative     CBC with Auto Differential    Collection Time: 10/25/22  1:51 PM   Result Value Ref Range    WBC 10.1 3.6 - 11.0 K/uL    RBC 4.09 3.80 - 5.20 M/uL    Hemoglobin 12.4 11.5 - 16.0 g/dL    Hematocrit 37.3 35.0 - 47.0 %    MCV 91.2 80.0 - 99.0 FL    MCH 30.3 26.0 - 34.0 PG    MCHC 33.2 30.0 - 36.5 g/dL    RDW 13.1 11.5 - 14.5 %    Platelets 243 150 - 400 K/uL    MPV 9.4 8.9 - 12.9 FL    Nucleated RBCs 0.0 0.0 PER 100 WBC    nRBC 0.00 0.00 - 0.01 K/uL    Neutrophils % 78 (H) 32 - 75 %    Lymphocytes % 15 12 - 49 %    Monocytes % 7 5 - 13 %    Eosinophils % 0 0 - 7 %    Basophils % 0 0 - 1 %    Immature Granulocytes 0 0 - 0.5 %    Neutrophils Absolute 7.8 1.8 - 8.0 K/UL    Lymphocytes Absolute 1.5 0.8 - 3.5 K/UL    Monocytes Absolute 0.7 0.0 -  1.0 K/UL    Eosinophils  Absolute 0.0 0.0 - 0.4 K/UL    Basophils Absolute 0.0 0.0 - 0.1 K/UL    Absolute Immature Granulocyte 0.0 0.00 - 0.04 K/UL    Differential Type AUTOMATED         EKG: Not Applicable    Radiologic Studies:  Non-plain film images such as CT, Ultrasound and MRI are read by the radiologist. Plain radiographic images are visualized and preliminarily interpreted by the ED Physician with the following findings: Not Applicable.    Interpretation per the Radiologist below, if available at the time of this note:  No orders to display        ED COURSE and DIFFERENTIAL DIAGNOSIS/MDM   6:16 PM Differential and Considerations:     Patient presents with flu-like symptoms including upper respiratory symptoms, myalgias, fever.  DDx: Influenza, URI, PNA, sinusitis.  Other diagnoses been considered including mononucleosis. will get consider labs and flu screen PRN.  CBC was unremarkable.  Monospot negative.  Patient will be discharged.  Based on presentation will cover with clindamycin for possible infection.  Notable lymphadenopathy right posterior leg auricular and right anterior cervical region.  Vital stable throughout entire stay.  Patient to return discussed with patient and she agrees to plan of action.    Records Reviewed (source and summary of external notes): Prior medical records and Nursing notes    Vitals:    Vitals:    10/25/22 1129 10/25/22 1131 10/25/22 1501   BP: 113/75  92/66   Pulse: 91  80   Resp:  16 14   Temp: 97.5 F (36.4 C)  97.8 F (36.6 C)   SpO2: 96%  100%   Weight:  59 kg (130 lb)    Height:  1.651 m (5\' 5" )         ED COURSE       SEPSIS Reassessment: Sepsis reassessment not applicable    Clinical Management Tools:  Not Applicable    Patient was given the following medications:  Medications   predniSONE (DELTASONE) tablet 40 mg (40 mg Oral Given 10/25/22 1313)   acetaminophen (TYLENOL) tablet 1,000 mg (1,000 mg Oral Given 10/25/22 1313)   ketorolac (TORADOL) injection 30 mg (30 mg  IntraMUSCular Given 10/25/22 1459)       CONSULTS: See ED Course/MDM for further details.  None     Social Determinants affecting Diagnosis/Treatment: None    Smoking Cessation: Smoking Cessation Counseling  Discussed the risks of smoking tobacco products and the long term sequelae of tobacco use with the patient such as increased risk of heart attack, stroke, peripheral artery disease and cancer. The patient verbalized their understanding and were provided resources if asked. Counseled patient for approximately 3 minutes.     PROCEDURES   Unless otherwise noted above, none.  Procedures      CRITICAL CARE TIME   Patient does not meet Critical Care Time, 0 minutes    ED IMPRESSION     1. Acute pharyngitis, unspecified etiology    2. Cervical lymphadenopathy          DISPOSITION/PLAN   DISPOSITION Decision To Discharge 10/25/2022 03:01:55 PM    Discharge Note: The patient is stable for discharge home. The signs, symptoms, diagnosis, and discharge instructions have been discussed, understanding conveyed, and agreed upon. The patient is to follow up as recommended or return to ER should their symptoms worsen.      PATIENT REFERRED TO:  Marlynn Perking, APRN - NP  [place]  [address]  [place] [address]  [phone]    Schedule an appointment as soon as possible for a visit       [facility]  [place]  [phone]    If symptoms worsen        DISCHARGE MEDICATIONS:     Medication List        START taking these medications      clindamycin 300 MG capsule  Commonly known as: CLEOCIN  Take 1 capsule by mouth 3 times daily for 10 days     predniSONE 20 MG tablet  Commonly known as: DELTASONE  Take 2 tablets by mouth daily for 4 days            ASK your doctor about these medications      ascorbic acid 250 MG tablet  Commonly known as: VITAMIN C     buPROPion 150 MG extended release tablet  Commonly known as: WELLBUTRIN SR  TAKE 1 TABLET BY MOUTH TWICE DAILY      drospirenone-ethinyl estradiol 3-0.02 MG per tablet  Commonly known as: YAZ     ferrous sulfate 325 (65 Fe) MG EC tablet  Commonly known as: FE TABS 325     methocarbamol 500 MG tablet  Commonly known as: ROBAXIN     norgestimate-ethinyl estradiol 0.25-35 MG-MCG per tablet  Commonly known as: Estarylla  Take 1 tablet by mouth daily     omeprazole 10 MG delayed release capsule  Commonly known as: PRILOSEC     Premarin 0.625 MG/GM Crea vaginal cream  Generic drug: estrogens conjugated  Place 1.5 g vaginally daily               Where to Get Your Medications        These medications were sent to [facility] [place], [place] [phone] - F [phone]  [place], [place] [address]      Phone: [phone]   clindamycin 300 MG capsule  predniSONE 20 MG tablet           DISCONTINUED MEDICATIONS:  Discharge Medication List as of 10/25/2022  2:56 PM          I am the Primary Clinician of Record: Derrek Gu, PA-C (electronically signed)    (Please note that parts of this dictation were completed with voice recognition software. Quite often unanticipated grammatical, syntax, homophones, and other interpretive errors are inadvertently transcribed by the computer software. Please disregards these errors. Please excuse any errors that have escaped final proofreading.)     Derrek Gu, PA-C  10/25/22 1816

## 2022-10-26 ENCOUNTER — Encounter

## 2022-10-26 LAB — CULTURE, THROAT: Culture: NORMAL

## 2023-01-15 ENCOUNTER — Encounter: Payer: MEDICAID | Attending: Specialist | Primary: Family

## 2023-08-20 ENCOUNTER — Encounter

## 2023-08-20 MED ORDER — CLINDAMYCIN HCL 300 MG PO CAPS
300 | ORAL_CAPSULE | Freq: Two times a day (BID) | ORAL | 0 refills | Status: AC
Start: 2023-08-20 — End: 2023-08-27

## 2023-09-16 ENCOUNTER — Encounter: Payer: MEDICAID | Attending: Specialist | Primary: Family

## 2023-10-15 ENCOUNTER — Encounter: Payer: MEDICAID | Attending: Specialist | Primary: Family

## 2023-11-15 ENCOUNTER — Encounter: Payer: MEDICAID | Attending: Specialist | Primary: Family
